# Patient Record
Sex: Female | Born: 2000 | Race: Black or African American | Hispanic: Yes | Marital: Single
Health system: Southern US, Community
[De-identification: ages and names within clinical notes are randomized; demographics above are authoritative.]

## PROBLEM LIST (undated history)

## (undated) ENCOUNTER — Inpatient Hospital Stay (HOSPITAL_COMMUNITY): Payer: Self-pay

## (undated) DIAGNOSIS — F319 Bipolar disorder, unspecified: Secondary | ICD-10-CM

## (undated) DIAGNOSIS — F419 Anxiety disorder, unspecified: Secondary | ICD-10-CM

## (undated) DIAGNOSIS — F913 Oppositional defiant disorder: Secondary | ICD-10-CM

## (undated) DIAGNOSIS — J45909 Unspecified asthma, uncomplicated: Secondary | ICD-10-CM

## (undated) DIAGNOSIS — F39 Unspecified mood [affective] disorder: Secondary | ICD-10-CM

## (undated) DIAGNOSIS — F329 Major depressive disorder, single episode, unspecified: Secondary | ICD-10-CM

## (undated) DIAGNOSIS — F32A Depression, unspecified: Secondary | ICD-10-CM

## (undated) HISTORY — PX: OTHER SURGICAL HISTORY: SHX169

## (undated) HISTORY — PX: TONSILLECTOMY: SUR1361

---
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
## 2018-04-08 ENCOUNTER — Emergency Department (HOSPITAL_COMMUNITY): Payer: Medicaid Other

## 2018-04-08 ENCOUNTER — Other Ambulatory Visit: Payer: Self-pay

## 2018-04-08 ENCOUNTER — Emergency Department (HOSPITAL_COMMUNITY)
Admission: EM | Admit: 2018-04-08 | Discharge: 2018-04-09 | Disposition: A | Discharge status: Home / Self Care | Payer: Medicaid Other | Attending: Emergency Medicine | Admitting: Emergency Medicine

## 2018-04-08 ENCOUNTER — Encounter (HOSPITAL_COMMUNITY): Payer: Self-pay | Admitting: Emergency Medicine

## 2018-04-08 DIAGNOSIS — Z3A01 Less than 8 weeks gestation of pregnancy: Secondary | ICD-10-CM | POA: Insufficient documentation

## 2018-04-08 DIAGNOSIS — J45909 Unspecified asthma, uncomplicated: Secondary | ICD-10-CM | POA: Insufficient documentation

## 2018-04-08 DIAGNOSIS — B9689 Other specified bacterial agents as the cause of diseases classified elsewhere: Secondary | ICD-10-CM

## 2018-04-08 DIAGNOSIS — O26891 Other specified pregnancy related conditions, first trimester: Secondary | ICD-10-CM | POA: Diagnosis not present

## 2018-04-08 DIAGNOSIS — R102 Pelvic and perineal pain: Secondary | ICD-10-CM | POA: Diagnosis not present

## 2018-04-08 DIAGNOSIS — O23591 Infection of other part of genital tract in pregnancy, first trimester: Secondary | ICD-10-CM | POA: Diagnosis not present

## 2018-04-08 DIAGNOSIS — Z7722 Contact with and (suspected) exposure to environmental tobacco smoke (acute) (chronic): Secondary | ICD-10-CM | POA: Insufficient documentation

## 2018-04-08 DIAGNOSIS — Z79899 Other long term (current) drug therapy: Secondary | ICD-10-CM | POA: Insufficient documentation

## 2018-04-08 DIAGNOSIS — N760 Acute vaginitis: Secondary | ICD-10-CM

## 2018-04-08 LAB — WET PREP, GENITAL
Sperm: NONE SEEN
Trich, Wet Prep: NONE SEEN
Yeast Wet Prep HPF POC: NONE SEEN

## 2018-04-08 LAB — COMPREHENSIVE METABOLIC PANEL
ALBUMIN: 3.8 g/dL (ref 3.5–5.0)
ALK PHOS: 69 U/L (ref 38–126)
ALT: 14 U/L (ref 0–44)
ANION GAP: 7 (ref 5–15)
AST: 17 U/L (ref 15–41)
BUN: 12 mg/dL (ref 6–20)
CALCIUM: 9 mg/dL (ref 8.9–10.3)
CO2: 24 mmol/L (ref 22–32)
CREATININE: 0.68 mg/dL (ref 0.44–1.00)
Chloride: 107 mmol/L (ref 98–111)
Glucose, Bld: 86 mg/dL (ref 70–99)
Potassium: 3.4 mmol/L — ABNORMAL LOW (ref 3.5–5.1)
Sodium: 138 mmol/L (ref 135–145)
TOTAL PROTEIN: 6.9 g/dL (ref 6.5–8.1)
Total Bilirubin: 0.4 mg/dL (ref 0.3–1.2)

## 2018-04-08 LAB — URINALYSIS, ROUTINE W REFLEX MICROSCOPIC
Bilirubin Urine: NEGATIVE
Glucose, UA: NEGATIVE mg/dL
HGB URINE DIPSTICK: NEGATIVE
Ketones, ur: 20 mg/dL — AB
LEUKOCYTES UA: NEGATIVE
NITRITE: NEGATIVE
PROTEIN: NEGATIVE mg/dL
SPECIFIC GRAVITY, URINE: 1.028 (ref 1.005–1.030)
pH: 5 (ref 5.0–8.0)

## 2018-04-08 LAB — LIPASE, BLOOD: Lipase: 36 U/L (ref 11–51)

## 2018-04-08 LAB — RAPID HIV SCREEN (HIV 1/2 AB+AG)
HIV 1/2 ANTIBODIES: NONREACTIVE
HIV-1 P24 ANTIGEN - HIV24: NONREACTIVE

## 2018-04-08 LAB — CBC
HCT: 38.6 % (ref 36.0–46.0)
Hemoglobin: 13.4 g/dL (ref 12.0–15.0)
MCH: 31.3 pg (ref 26.0–34.0)
MCHC: 34.7 g/dL (ref 30.0–36.0)
MCV: 90.2 fL (ref 78.0–100.0)
PLATELETS: 340 10*3/uL (ref 150–400)
RBC: 4.28 MIL/uL (ref 3.87–5.11)
RDW: 12.2 % (ref 11.5–15.5)
WBC: 6.8 10*3/uL (ref 4.0–10.5)

## 2018-04-08 LAB — I-STAT BETA HCG BLOOD, ED (MC, WL, AP ONLY): HCG, QUANTITATIVE: 677.3 m[IU]/mL — AB (ref <5)

## 2018-04-08 IMAGING — US US OB TRANSVAGINAL
1 series · 13 of 28 positions shown · non-contrast
Comparison: None.

CLINICAL DATA: Pain.  Positive pregnancy test.

EXAM:
OBSTETRIC <14 WK US AND TRANSVAGINAL OB US
TECHNIQUE: Both transabdominal and transvaginal ultrasound examinations were
performed for complete evaluation of the gestation as well as the
maternal uterus, adnexal regions, and pelvic cul-de-sac.
Transvaginal technique was performed to assess early pregnancy.

[Series 1: us ob transvaginal · 13 of 83 slices shown]
[im 4/83]
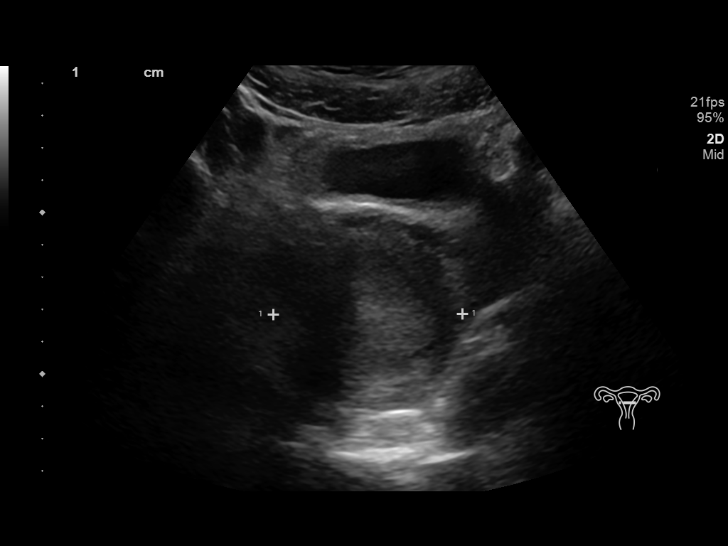
[im 10/83]
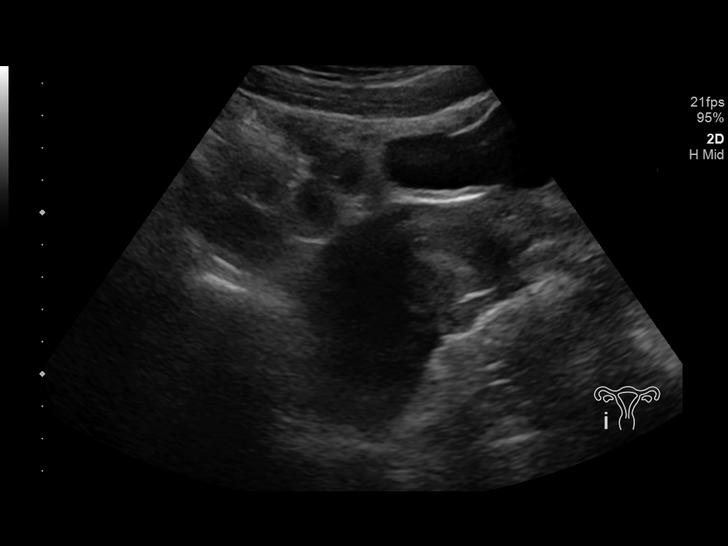
[im 16/83]
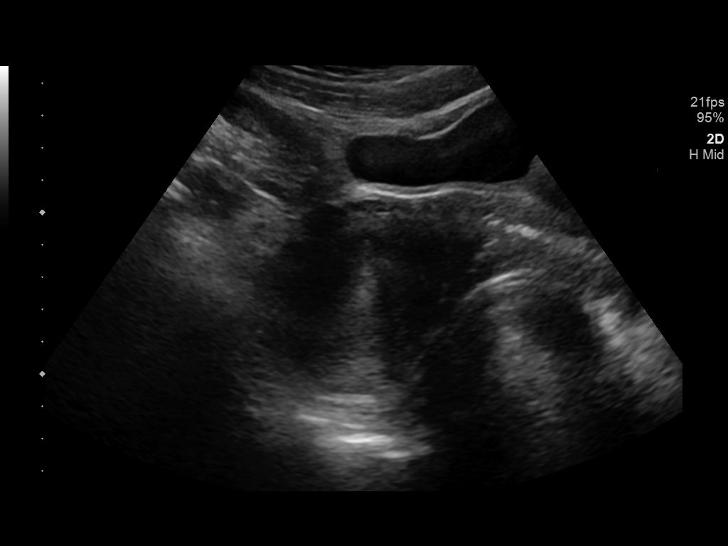
[im 22/83]
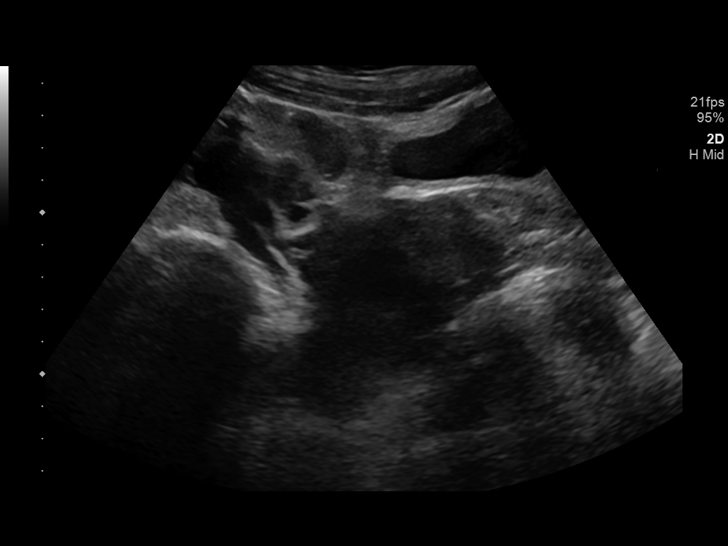
[im 28/83]
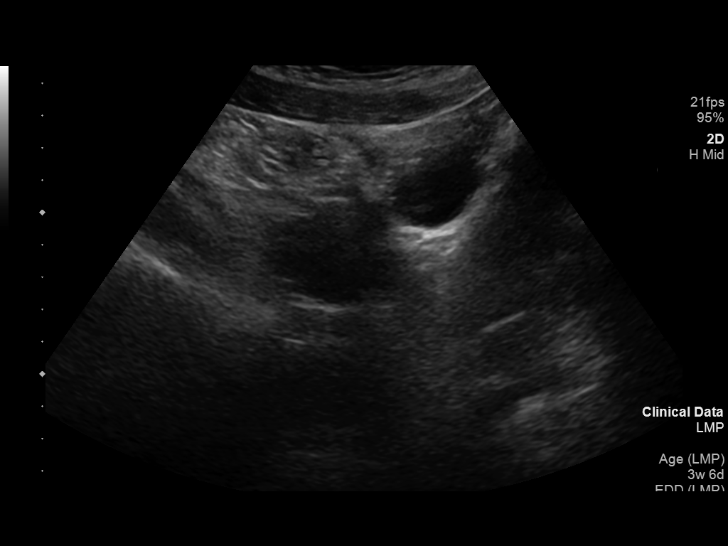
[im 34/83]
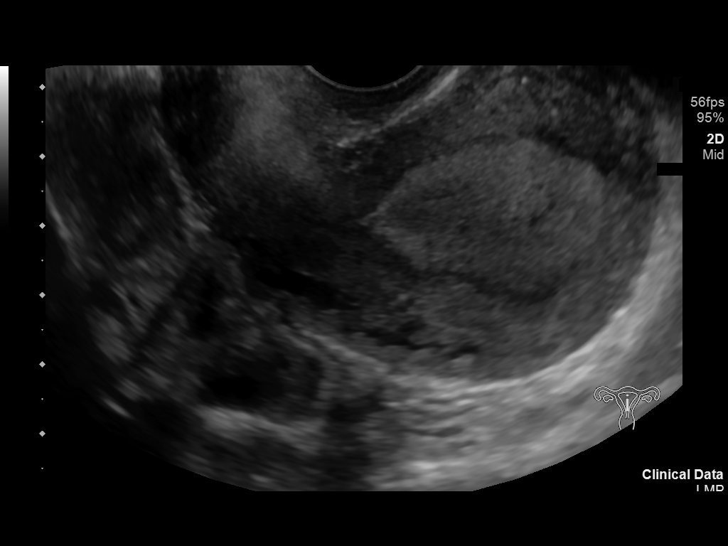
[im 43/83]
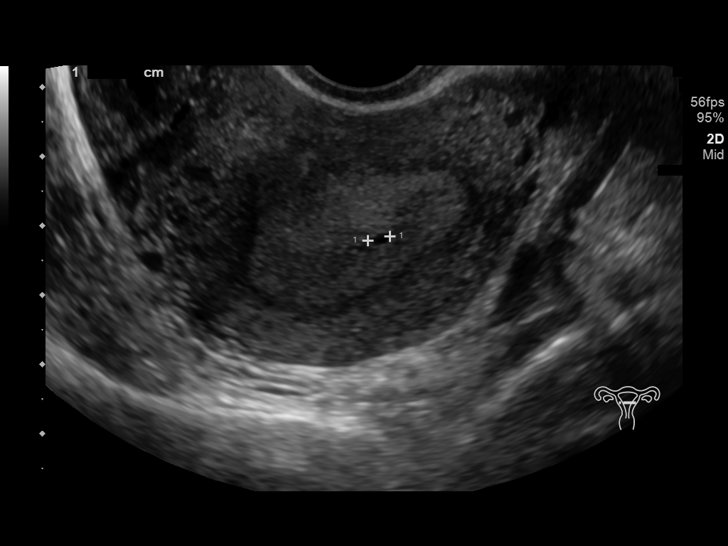
[im 49/83]
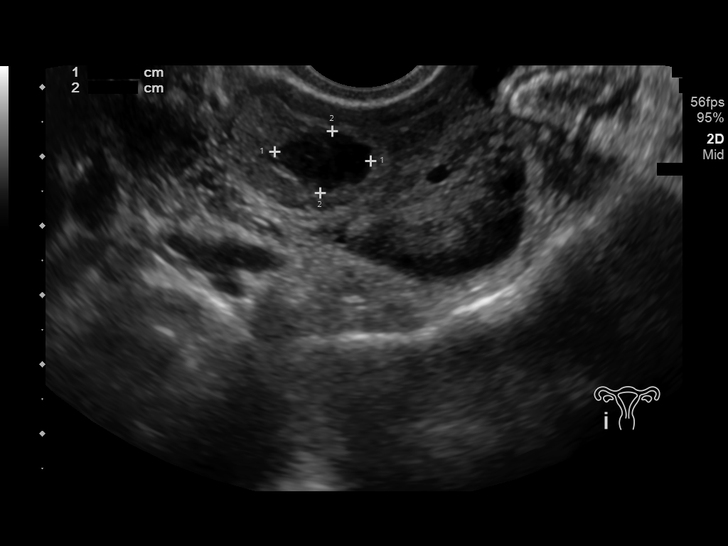
[im 55/83]
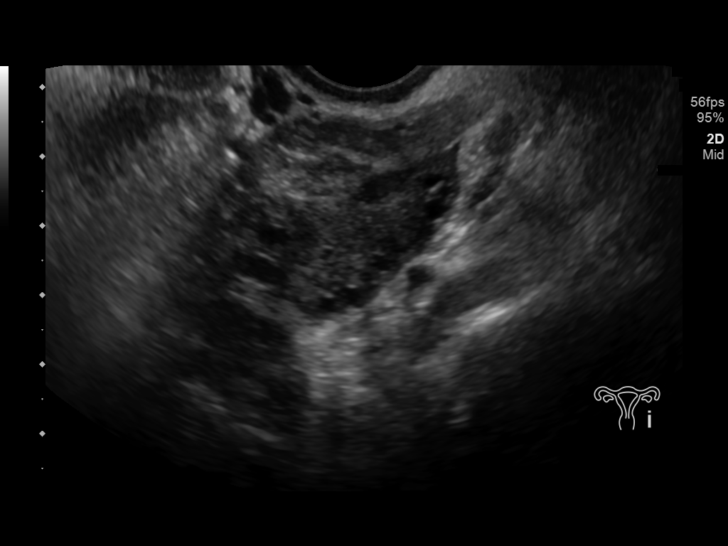
[im 61/83]
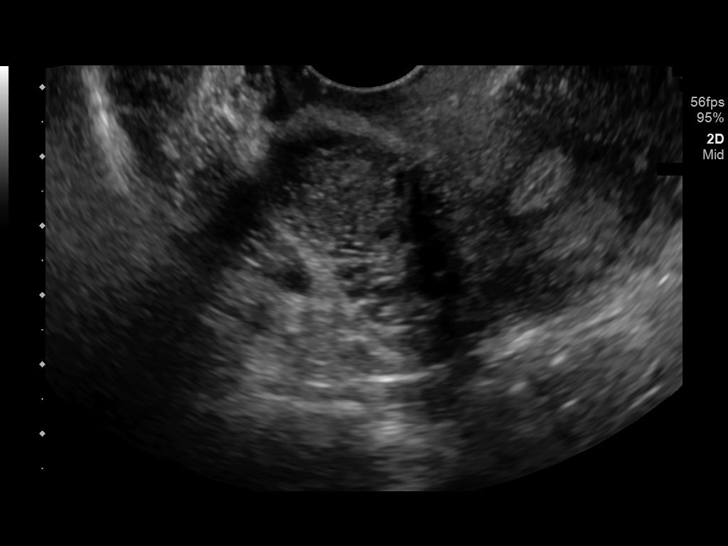
[im 67/83]
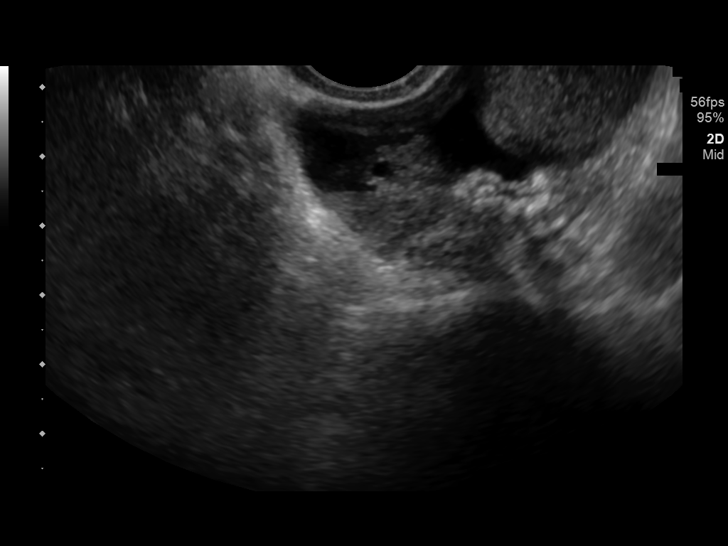
[im 73/83]
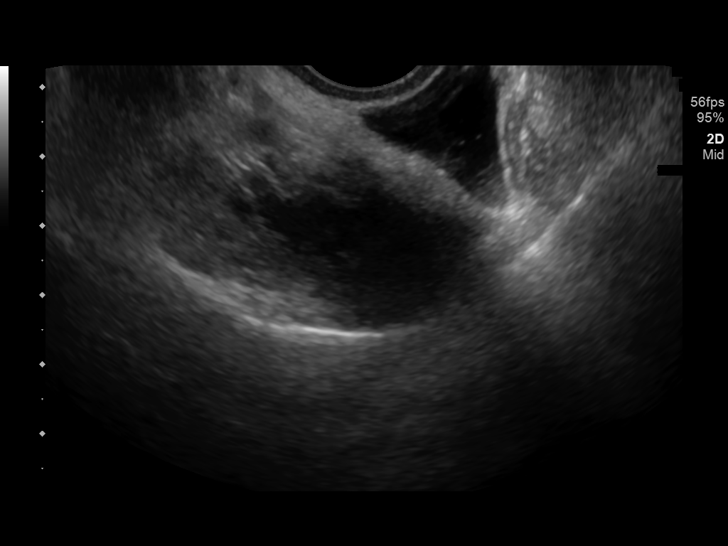
[im 79/83]
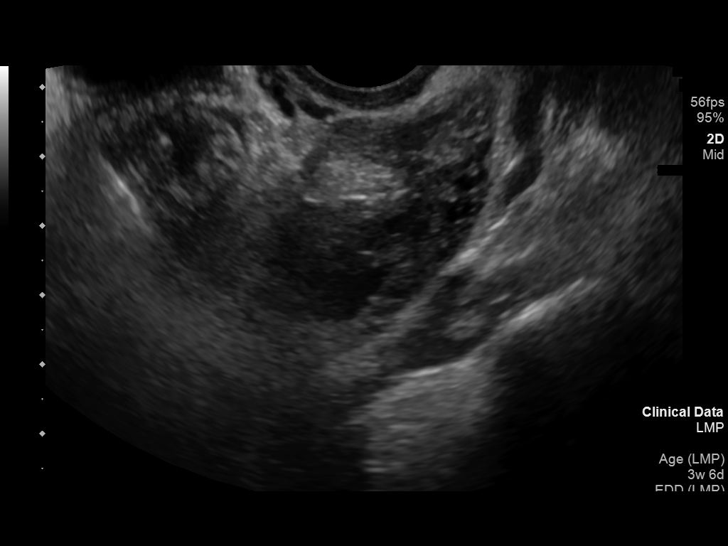

[13 of 28 positions shown; findings below may reference images not displayed]

FINDINGS: Intrauterine gestational sac: Possible tiny gestational sac

Yolk sac:  Not Visualized.

Embryo:  Not Visualized.

Cardiac Activity:

Heart Rate:   bpm

MSD: 2.5 mm   5 w   0 d

CRL:    mm    w    d                  US EDC:

Subchorionic hemorrhage:  None visualized.

Maternal uterus/adnexae: The left ovary is normal. There is an
irregular thick walled cystic structure which was thought to be
intra ovarian at the time of real-time imaging measuring 1.4 x 0.9 x
0.9 cm. No significant fluid in the pelvis. A tiny amount of trace
fluid near the right ovary and the fundus is likely physiologic.
IMPRESSION: 1. There is a tiny oval region of fluid within thickened endometrium
which is too small to confidently characterize. This could represent
an early gestational sac or pseudo gestational sac. As a result,
early pregnancy, ectopic pregnancy, and recent miscarriage are all
possible. Recommend close clinical follow-up. Also, recommend close
imaging and lab follow-up as clinically warranted.
2. There is a thick walled irregular cystic structure in the right
adnexa which was thought to be within the right ovary at the time of
real-time imaging. This is likely a corpus luteum cyst. Recommend
attention on follow-up.

## 2018-04-08 MED ORDER — METRONIDAZOLE 0.75 % VA GEL: 1.0000 | Freq: Two times a day (BID) | VAGINAL | 0 refills | Status: DC

## 2018-04-08 MED ORDER — AZITHROMYCIN 250 MG PO TABS
1000.0000 mg | ORAL_TABLET | Freq: Once | ORAL | Status: AC
  Administered 2018-04-09: 1000 mg via ORAL
  Filled 2018-04-08: qty 4

## 2018-04-08 MED ORDER — CEFTRIAXONE SODIUM 250 MG IJ SOLR
250.0000 mg | Freq: Once | INTRAMUSCULAR | Status: AC
  Administered 2018-04-09: 250 mg via INTRAMUSCULAR
  Filled 2018-04-08: qty 250

## 2018-04-08 MED ORDER — LIDOCAINE HCL 1 % IJ SOLN
INTRAMUSCULAR | Status: AC
  Administered 2018-04-09: 0.9 mL
  Filled 2018-04-08: qty 20

## 2018-04-08 NOTE — ED Provider Notes (Signed)
[facility]-EMERGENCY DEPT Provider Note   CSN: [id number] Arrival date & time: 04/08/18  1958     History   Chief Complaint Chief Complaint  Patient presents with  . Abdominal Pain    HPI Cheryl Harrell is a 17 y.o. female.  The history is provided by the patient. No language interpreter was used.  Abdominal Pain       17 year old female with history of bipolar, oppositional defiant disorder, depression, anxiety presenting for evaluation of abdominal pain.  Patient mention for the past month she has had intermittent abdominal pain.  Pain is usually in her lower abdomen, described as a sharp crampy sensation, sometimes lasting for hours.  She also endorsed having vaginal discharge with odor.  Today she also developed some mild frontal headache.  Her pain is rated a 7 out of 10.  Pain sometimes feels like menstrual cramping in which she used a warm bottle to massage without adequate relief.  No associated fever, chills, chest pains, shortness of breath, back pain, dysuria, hematuria or vaginal bleeding.  Her last menstrual period was 09/03.  She is sexually active.  Past Medical History:  Diagnosis Date  . Anxiety   . Anxiety   . Asthma   . Bipolar 1 disorder (HCC)   . Depression   . Mood disorder (HCC)   . ODD (oppositional defiant disorder)     Patient Active Problem List   Diagnosis Date Noted  . Attention deficit hyperactivity disorder (ADHD), combined type, moderate 06/15/2014  . Chronic post-traumatic stress disorder (PTSD) 06/13/2014  . Oppositional defiant disorder 06/30/2013  . Bipolar I disorder, most recent episode depressed, severe without psychotic features (HCC) 06/30/2013    Past Surgical History:  Procedure Laterality Date  . TONSILLECTOMY    . wisdom tooth removal       OB History   None      Home Medications    Prior to Admission medications   Medication Sig Start Date End Date Taking? Authorizing Provider    ARIPiprazole (ABILIFY) 2 MG tablet Take 1 tablet (2 mg total) by mouth at bedtime. 12/10/14   Withrow, Everardo All, FNP  buPROPion (WELLBUTRIN XL) 300 MG 24 hr tablet Take 1 tablet (300 mg total) by mouth daily. 12/10/14   Withrow, Everardo All, FNP  divalproex (DEPAKOTE ER) 500 MG 24 hr tablet Take 2 tablets (1,000 mg total) by mouth at bedtime. 12/10/14   Withrow, Everardo All, FNP  hydrOXYzine (VISTARIL) 25 MG capsule Take 25 mg by mouth every 8 (eight) hours as needed for anxiety.    [provider]  medroxyPROGESTERone (DEPO-PROVERA) 150 MG/ML injection Inject 1 mL (150 mg total) into the muscle every 3 (three) months. To be managed outpatient and given: September 2nd, 2016 12/10/14   Beau Fanny, FNP  traZODone (DESYREL) 50 MG tablet Take 50 mg by mouth at bedtime.    [provider]    Family History Family History  Problem Relation Age of Onset  . Diabetes Neg Hx   . Heart failure Neg Hx   . Hyperlipidemia Neg Hx     Social History Social History   Tobacco Use  . Smoking status: Passive Smoke Exposure - Never Smoker  . Smokeless tobacco: Never Used  Substance Use Topics  . Alcohol use: No  . Drug use: No     Allergies   Shellfish allergy   Review of Systems Review of Systems  Gastrointestinal: Positive for abdominal pain.  All other systems  reviewed and are negative.    Physical Exam Updated Vital Signs BP 128/74 (BP Location: Left Arm)   Pulse 95   Temp 98.8 F (37.1 C) (Oral)   Resp 17   Ht 5\' 1"  (1.549 m)   Wt 81.6 kg   LMP 03/07/2018   SpO2 100%   BMI 34.01 kg/m   Physical Exam  Constitutional: She appears well-developed and well-nourished. No distress.  HENT:  Head: Atraumatic.  Eyes: Conjunctivae are normal.  Neck: Neck supple.  Cardiovascular: Normal rate and regular rhythm.  Pulmonary/Chest: Effort normal and breath sounds normal.  Abdominal: Soft. Normal appearance and bowel sounds are normal. There is tenderness (Mild suprapubic tenderness  without guarding or rebound tenderness.) in the suprapubic area.  Genitourinary:  Genitourinary Comments: Chaperone present during exam.  No inguinal lymph adenopathy or inguinal hernia noted.  Normal external genitalia.  Discomfort with speculum insertion.  Normal vaginal vault without discharge.  Cervical os is closed.  On bimanual exam no adnexal tenderness or CMT.   Neurological: She is alert.  Skin: No rash noted.  Psychiatric: She has a normal mood and affect.  Nursing note and vitals reviewed.    ED Treatments / Results  Labs (all labs ordered are listed, but only abnormal results are displayed) Labs Reviewed  WET PREP, GENITAL - Abnormal; Notable for the following components:      Result Value   Clue Cells Wet Prep HPF POC PRESENT (*)    WBC, Wet Prep HPF POC FEW (*)    All other components within normal limits  COMPREHENSIVE METABOLIC PANEL - Abnormal; Notable for the following components:   Potassium 3.4 (*)    All other components within normal limits  URINALYSIS, ROUTINE W REFLEX MICROSCOPIC - Abnormal; Notable for the following components:   Ketones, ur 20 (*)    All other components within normal limits  I-STAT BETA HCG BLOOD, ED ([facility], [facility], [facility] ONLY) - Abnormal; Notable for the following components:   I-stat hCG, quantitative 677.3 (*)    All other components within normal limits  LIPASE, BLOOD  CBC  RAPID HIV SCREEN (HIV 1/2 AB+AG)  RPR  GC/CHLAMYDIA PROBE AMP ([place]) NOT AT [facility]    EKG None  Radiology US Ob Comp < 14 Wks  Result Date: 04/08/2018 CLINICAL DATA:  Pain.  Positive pregnancy test. EXAM: OBSTETRIC <14 WK [place] AND TRANSVAGINAL OB US TECHNIQUE: Both transabdominal and transvaginal ultrasound examinations were performed for complete evaluation of the gestation as well as the maternal uterus, adnexal regions, and pelvic cul-de-sac. Transvaginal technique was performed to assess early pregnancy. COMPARISON:  None. FINDINGS: Intrauterine gestational sac:  Possible tiny gestational sac Yolk sac:  Not Visualized. Embryo:  Not Visualized. Cardiac Activity: Heart Rate:   bpm MSD: 2.5 mm   5 w   0 d CRL:    mm    w    d                  [place] EDC: Subchorionic hemorrhage:  None visualized. Maternal uterus/adnexae: The left ovary is normal. There is an irregular thick walled cystic structure which was thought to be intra ovarian at the time of real-time imaging measuring 1.4 x 0.9 x 0.9 cm. No significant fluid in the pelvis. A tiny amount of trace fluid near the right ovary and the fundus is likely physiologic. IMPRESSION: 1. There is a tiny oval region of fluid within thickened endometrium which is too small to confidently characterize. This  could represent an early gestational sac or pseudo gestational sac. As a result, early pregnancy, ectopic pregnancy, and recent miscarriage are all possible. Recommend close clinical follow-up. Also, recommend close imaging and lab follow-up as clinically warranted. 2. There is a thick walled irregular cystic structure in the right adnexa which was thought to be within the right ovary at the time of real-time imaging. This is likely a corpus luteum cyst. Recommend attention on follow-up. Electronically Signed   By: Gerome Sam III M.D   On: 04/08/2018 22:41   US Ob Transvaginal  Result Date: 04/08/2018 CLINICAL DATA:  Pain.  Positive pregnancy test. EXAM: OBSTETRIC <14 WK [place] AND TRANSVAGINAL OB US TECHNIQUE: Both transabdominal and transvaginal ultrasound examinations were performed for complete evaluation of the gestation as well as the maternal uterus, adnexal regions, and pelvic cul-de-sac. Transvaginal technique was performed to assess early pregnancy. COMPARISON:  None. FINDINGS: Intrauterine gestational sac: Possible tiny gestational sac Yolk sac:  Not Visualized. Embryo:  Not Visualized. Cardiac Activity: Heart Rate:   bpm MSD: 2.5 mm   5 w   0 d CRL:    mm    w    d                  [place] EDC: Subchorionic hemorrhage:  None  visualized. Maternal uterus/adnexae: The left ovary is normal. There is an irregular thick walled cystic structure which was thought to be intra ovarian at the time of real-time imaging measuring 1.4 x 0.9 x 0.9 cm. No significant fluid in the pelvis. A tiny amount of trace fluid near the right ovary and the fundus is likely physiologic. IMPRESSION: 1. There is a tiny oval region of fluid within thickened endometrium which is too small to confidently characterize. This could represent an early gestational sac or pseudo gestational sac. As a result, early pregnancy, ectopic pregnancy, and recent miscarriage are all possible. Recommend close clinical follow-up. Also, recommend close imaging and lab follow-up as clinically warranted. 2. There is a thick walled irregular cystic structure in the right adnexa which was thought to be within the right ovary at the time of real-time imaging. This is likely a corpus luteum cyst. Recommend attention on follow-up. Electronically Signed   By: Gerome Sam III M.D   On: 04/08/2018 22:41    Procedures Pelvic exam Date/Time: 04/08/2018 9:20 PM Performed by: Fayrene Helper, PA-C Authorized by: Fayrene Helper, PA-C  Consent: Verbal consent obtained. Risks and benefits: risks, benefits and alternatives were discussed Patient identity confirmed: verbally with patient Local anesthesia used: no  Anesthesia: Local anesthesia used: no  Sedation: Patient sedated: no  Patient tolerance: Patient tolerated the procedure well with no immediate complications    (including critical care time)  Medications Ordered in ED Medications - No data to display   Initial Impression / Assessment and Plan / ED Course  I have reviewed the triage vital signs and the nursing notes.  Pertinent labs & imaging results that were available during my care of the patient were reviewed by me and considered in my medical decision making (see chart for details).     BP 113/63 (BP Location:  Left Arm)   Pulse 83   Temp 99.1 F (37.3 C) (Oral)   Resp 18   Ht 5\' 1"  (1.549 m)   Wt 81.6 kg   LMP 03/07/2018   SpO2 100%   BMI 34.01 kg/m    Final Clinical Impressions(s) / ED Diagnoses   Final diagnoses:  None    ED Discharge Orders    None     8:55 PM Patient report having lower abdominal pain, vaginal discharge, and also concerned about potential pregnancy.  She is sexually active.  She has a fairly benign abdominal exam.  Will perform pelvic examination.  9:21 PM Patient voiced concern that she may be pregnant as she is having unprotected sexual intercourse.  Her last menstrual.  Was a month ago.  Today i-STAT hCG is 677 indicating that patient is indeed pregnant.  Urine shows no signs of urinary tract infection.  Pelvic examination without concerning features to suggest PID.  Will obtain transvaginal ultrasound to rule out ectopic pregnancy.  11:25 PM Wet prep shows presence of clue cells and few WBC.  Rapid HIV screen is negative.  RPR is pending.  Urine without signs of urinary tract infection, transvaginal ultrasound demonstrate a possible tiny gestational sac estimated to be 5 weeks.  However the at this time, early pregnancy, ectopic pregnancy, and recent miscarriage are all possible and patient will need to have close follow-up.  I discussed this finding with patient and recommend [facility] with [facility] for further care.  Patient discharged home with Flagyl gel as treatment for BV.  Return precautions discussed.   Fayrene Helper, PA-C 04/08/18 [id number]    Gerhard Munch, MD 04/08/18 [phone]

## 2018-04-08 NOTE — ED Triage Notes (Signed)
Pt reports having abdominal pain for the last month. Unsure if pt may be pregnant.

## 2018-04-08 NOTE — Discharge Instructions (Signed)
You are estimated to be [redacted] weeks pregnant.  However, please call and follow up closely with [facility] next week for recheck.  Take metrogel vaginal cream twice daily for 1 week as treatment for bacterial vaginosis.

## 2018-04-10 LAB — RPR, QUANT+TP ABS (REFLEX)
Rapid Plasma Reagin, Quant: 1:2 {titer} — ABNORMAL HIGH
TREPONEMA PALLIDUM AB: NEGATIVE

## 2018-04-10 LAB — RPR: RPR Ser Ql: REACTIVE — AB

## 2018-04-10 LAB — GC/CHLAMYDIA PROBE AMP (~~LOC~~) NOT AT ARMC
Chlamydia: NEGATIVE
NEISSERIA GONORRHEA: NEGATIVE

## 2018-04-15 ENCOUNTER — Encounter (HOSPITAL_COMMUNITY): Payer: Self-pay | Admitting: *Deleted

## 2018-04-15 ENCOUNTER — Inpatient Hospital Stay (HOSPITAL_COMMUNITY)
Admission: AD | Admit: 2018-04-15 | Discharge: 2018-04-15 | Disposition: A | Discharge status: Home / Self Care | Payer: Medicaid Other | Source: Ambulatory Visit | Attending: Family Medicine | Admitting: Family Medicine

## 2018-04-15 ENCOUNTER — Inpatient Hospital Stay (HOSPITAL_COMMUNITY): Payer: Medicaid Other

## 2018-04-15 DIAGNOSIS — Z7722 Contact with and (suspected) exposure to environmental tobacco smoke (acute) (chronic): Secondary | ICD-10-CM | POA: Diagnosis not present

## 2018-04-15 DIAGNOSIS — O3680X0 Pregnancy with inconclusive fetal viability, not applicable or unspecified: Secondary | ICD-10-CM

## 2018-04-15 DIAGNOSIS — Z3A01 Less than 8 weeks gestation of pregnancy: Secondary | ICD-10-CM | POA: Insufficient documentation

## 2018-04-15 DIAGNOSIS — N898 Other specified noninflammatory disorders of vagina: Secondary | ICD-10-CM

## 2018-04-15 DIAGNOSIS — R109 Unspecified abdominal pain: Secondary | ICD-10-CM | POA: Diagnosis present

## 2018-04-15 DIAGNOSIS — O26891 Other specified pregnancy related conditions, first trimester: Secondary | ICD-10-CM | POA: Diagnosis not present

## 2018-04-15 LAB — CBC
HEMATOCRIT: 37.3 % (ref 36.0–46.0)
HEMOGLOBIN: 12.9 g/dL (ref 12.0–15.0)
MCH: 31 pg (ref 26.0–34.0)
MCHC: 34.6 g/dL (ref 30.0–36.0)
MCV: 89.7 fL (ref 80.0–100.0)
Platelets: 297 10*3/uL (ref 150–400)
RBC: 4.16 MIL/uL (ref 3.87–5.11)
RDW: 12 % (ref 11.5–15.5)
WBC: 8.4 10*3/uL (ref 4.0–10.5)
nRBC: 0 % (ref 0.0–0.2)

## 2018-04-15 LAB — URINALYSIS, ROUTINE W REFLEX MICROSCOPIC
Bilirubin Urine: NEGATIVE
GLUCOSE, UA: NEGATIVE mg/dL
HGB URINE DIPSTICK: NEGATIVE
Ketones, ur: NEGATIVE mg/dL
Leukocytes, UA: NEGATIVE
Nitrite: NEGATIVE
PH: 7 (ref 5.0–8.0)
Protein, ur: NEGATIVE mg/dL
SPECIFIC GRAVITY, URINE: 1.021 (ref 1.005–1.030)

## 2018-04-15 LAB — WET PREP, GENITAL
CLUE CELLS WET PREP: NONE SEEN
Sperm: NONE SEEN
Trich, Wet Prep: NONE SEEN
YEAST WET PREP: NONE SEEN

## 2018-04-15 LAB — HCG, QUANTITATIVE, PREGNANCY: hCG, Beta Chain, Quant, S: 7862 m[IU]/mL — ABNORMAL HIGH (ref <5)

## 2018-04-15 IMAGING — US US OB TRANSVAGINAL
1 series · 15 of 25 positions shown · non-contrast
Comparison: 04/08/2018.

CLINICAL DATA: Follow-up. Quantitative beta HCG is pending.
Estimated gestational age by LMP is 5 weeks 0 days.

EXAM:
TRANSVAGINAL OB ULTRASOUND
TECHNIQUE: Transvaginal ultrasound was performed for complete evaluation of the
gestation as well as the maternal uterus, adnexal regions, and
pelvic cul-de-sac.

[Series 1: us ob transvaginal · 25 acquisitions, 15 frames shown]
[im 1/25]
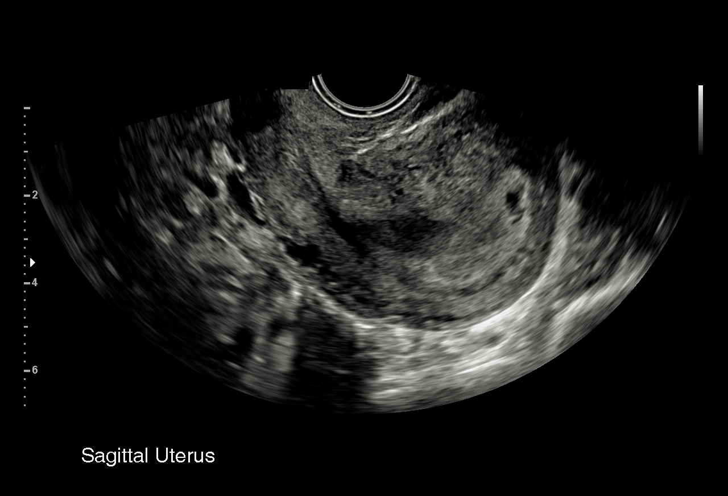
[im 3/25]
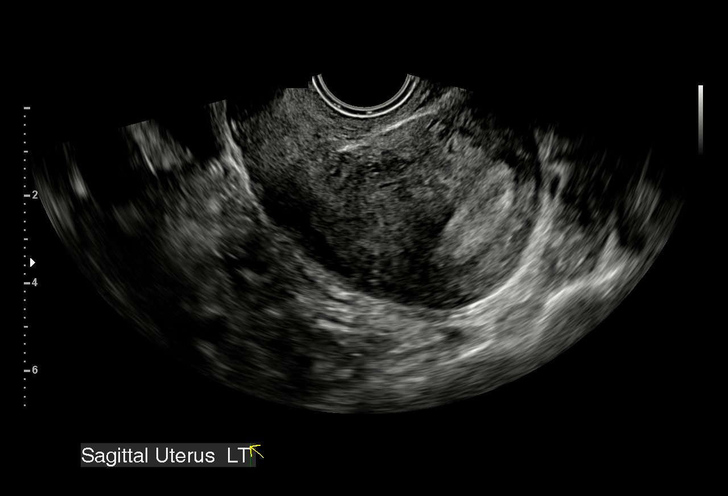
[im 5/25]
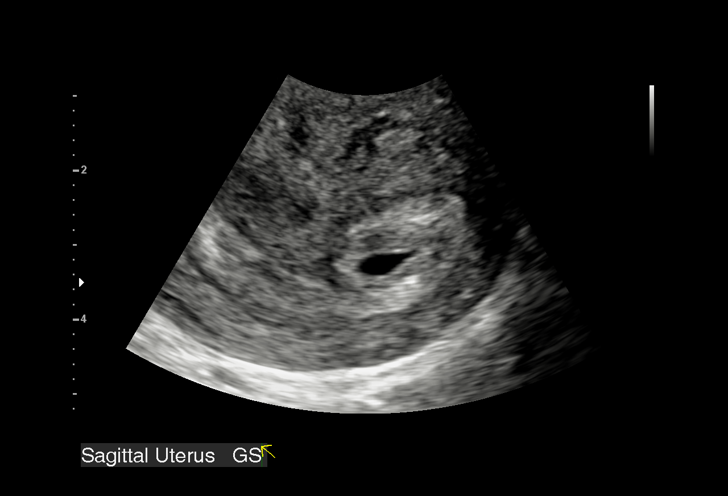
[im 6/25]
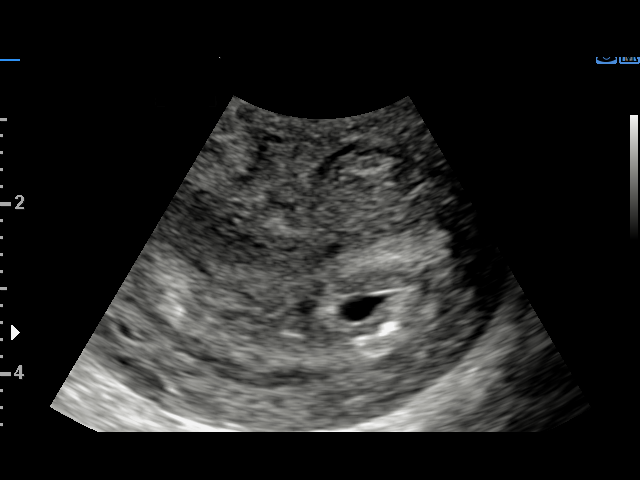
[im 8/25]
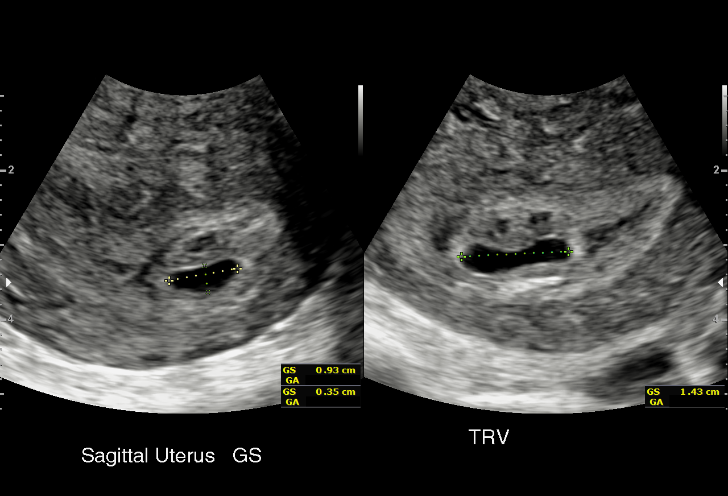
[im 10/25]
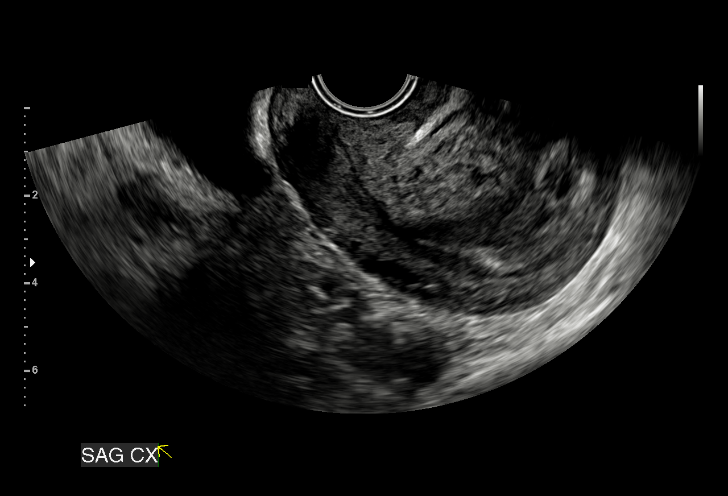
[im 11/25]
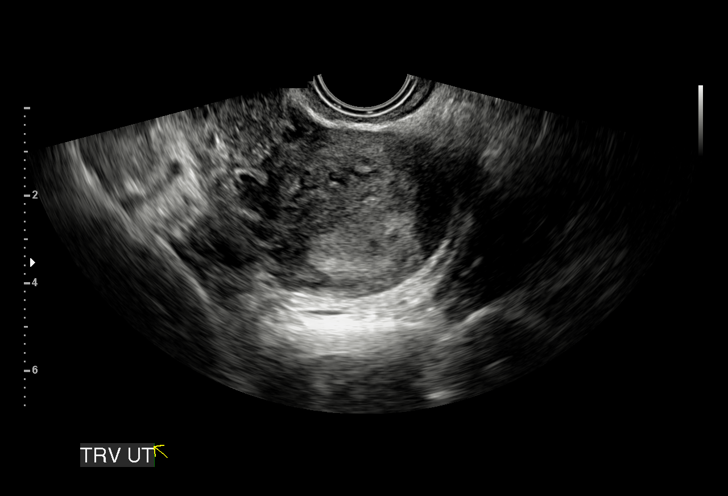
[im 13/25]
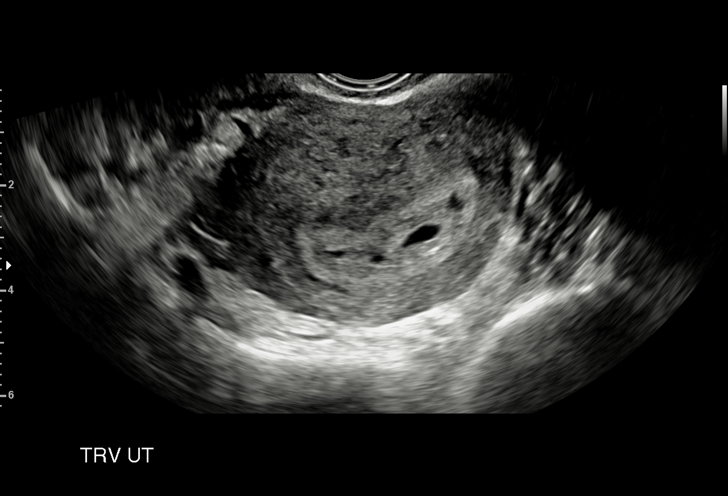
[im 15/25]
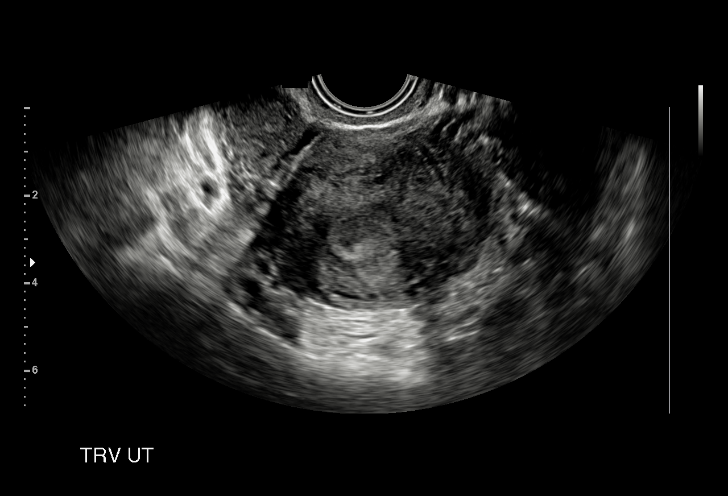
[im 16/25]
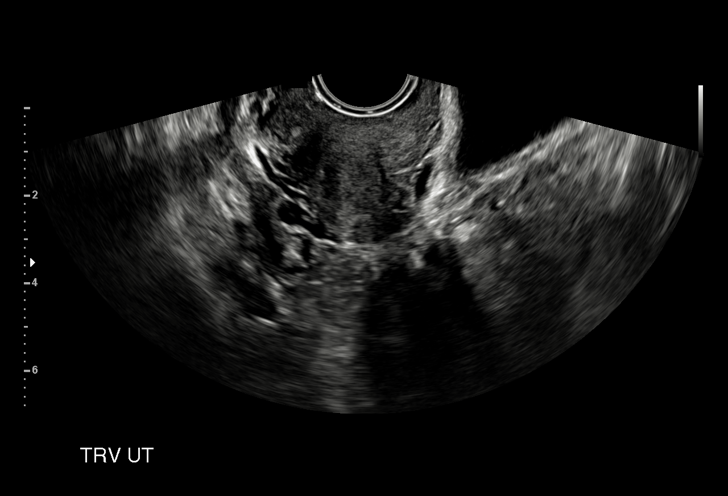
[im 18/25]
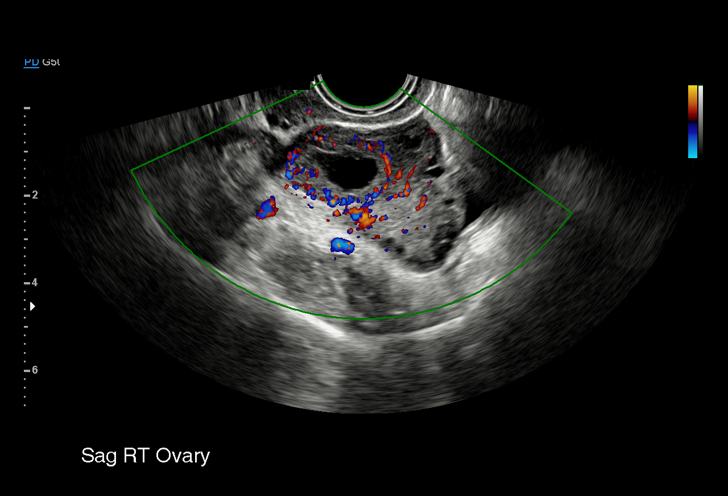
[im 20/25]
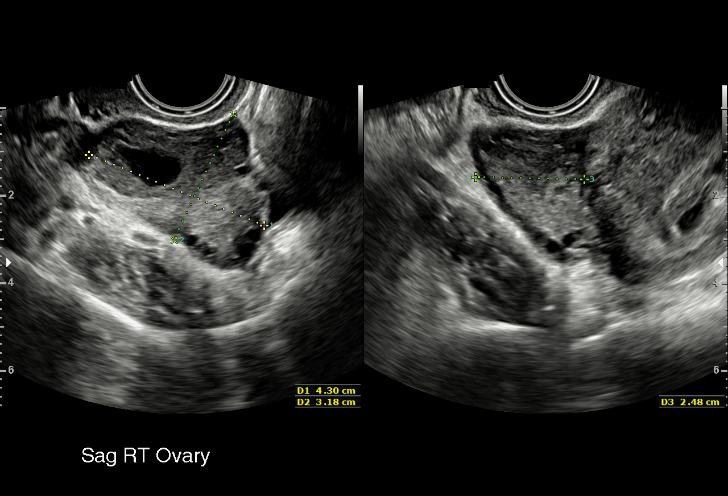
[im 21/25]
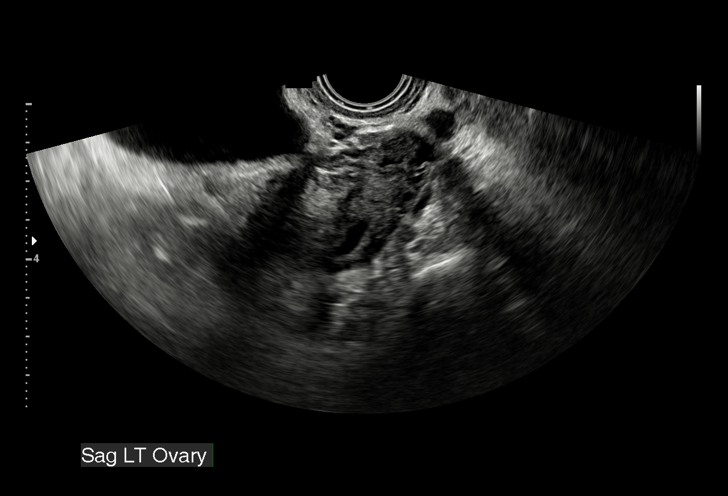
[im 23/25]
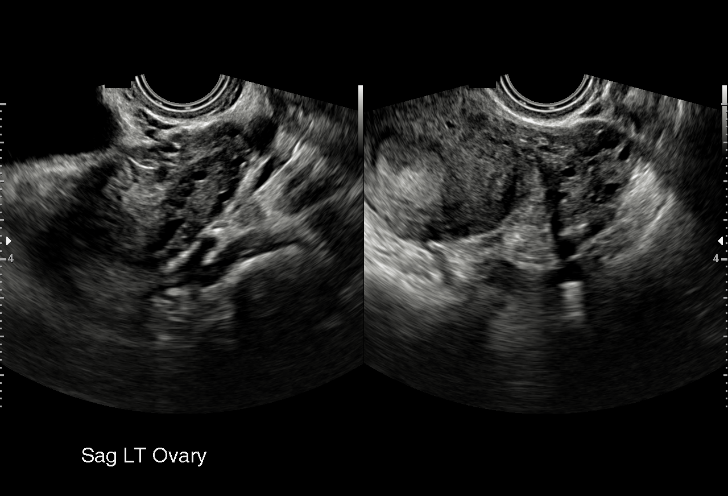
[im 25/25]
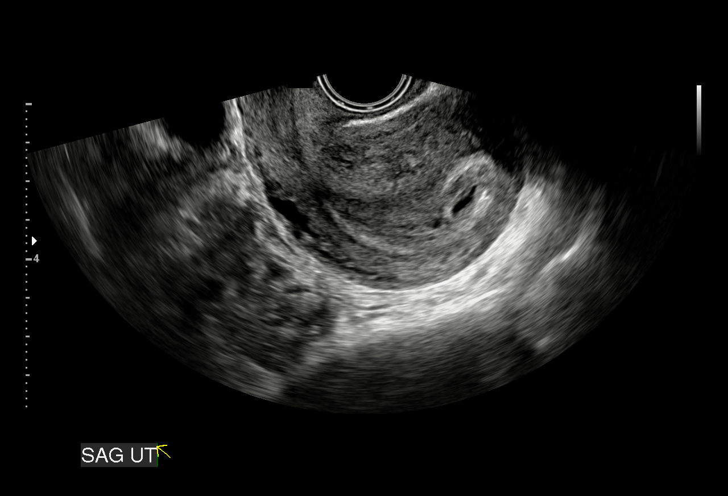

[15 of 25 positions shown; findings below may reference images not displayed]

FINDINGS: Intrauterine gestational sac: A single intrauterine gestational sac
is identified.

Yolk sac:  Not Visualized.

Embryo:  Not Visualized.

Cardiac Activity: Not Visualized.

MSD: 9 mm   5 w   5 d

Subchorionic hemorrhage:  None visualized.

Maternal uterus/adnexae: The uterus is retroverted. No myometrial
mass lesions are identified. Both ovaries are visualized. Probable
corpus luteal cyst on the right. Small amount of free fluid in the
pelvis.
IMPRESSION: Probable early intrauterine gestational sac, but no yolk sac, fetal
pole, or cardiac activity yet visualized. Interval growth in size of
the gestational sac since previous study is appropriate. Recommend
follow-up quantitative B-HCG levels and follow-up US in 14 days to
assess viability. This recommendation follows SRU consensus
guidelines: Diagnostic Criteria for Nonviable Pregnancy Early in the
First Trimester. N Engl J Med 3819; [DATE].

## 2018-04-15 MED ORDER — PRENATAL VITAMINS 0.8 MG PO TABS: 1.0000 | ORAL_TABLET | Freq: Every day | ORAL | Status: AC

## 2018-04-15 NOTE — MAU Note (Signed)
Cheryl Harrell is a 17 y.o. at Unknown here in MAU reporting: went to the ED about 1 week ago. Confirmed pregnancy. Endorses she was told that she was early and that she needed to follow up in 1 week at the [facility] to ensure everything is ok. Was given medication for a bacterial infections; however she thinks it has gotten worse because it has gone from regular discharge to white and clumpy. No odor. LMP:03/11/18 Pain score: denies Denies vaginal bleeding Vitals:   04/15/18 1900  BP: 111/65  Pulse: 79  Resp: 18  Temp: 99 F (37.2 C)  SpO2: 100%

## 2018-04-15 NOTE — MAU Provider Note (Signed)
History    CSN: [id number] Arrival date and time: 04/15/18 4098  Chief Complaint  Patient presents with  . follow up   HPI Cheryl Harrell is an 16yo G1P0 at [redacted]w[redacted]d by LMP who reports to follow-up ER visit for abdominal pain and pregnancy of unknown location. She was seen about a week ago in the ER for crampy, abdominal pain. She states she was told to report to [facility] in one week to follow-up which is why she is here today. Denies any vaginal bleeding. States she was also diagnosed with BV. Thinks odor and discharge is better but has noticed some white clumps. Denies itching. Has one day left of vaginal cream.   OB History    Gravida  1   Para      Term      Preterm      AB      Living        SAB      TAB      Ectopic      Multiple      Live Births              Past Medical History:  Diagnosis Date  . Anxiety   . Anxiety   . Asthma   . Bipolar 1 disorder (HCC)   . Depression   . Mood disorder (HCC)   . ODD (oppositional defiant disorder)     Past Surgical History:  Procedure Laterality Date  . TONSILLECTOMY    . wisdom tooth removal      Family History  Problem Relation Age of Onset  . Diabetes Neg Hx   . Heart failure Neg Hx   . Hyperlipidemia Neg Hx     Social History   Tobacco Use  . Smoking status: Passive Smoke Exposure - Never Smoker  . Smokeless tobacco: Never Used  Substance Use Topics  . Alcohol use: No  . Drug use: No    Allergies:  Allergies  Allergen Reactions  . Shellfish Allergy Other (See Comments)    Unknown, possibly not allergic anymore-per patient    Medications Prior to Admission  Medication Sig Dispense Refill Last Dose  . metroNIDAZOLE (METROGEL VAGINAL) 0.75 % vaginal gel Place 1 Applicatorful vaginally 2 (two) times daily. 70 g 0 04/15/2018 at Unknown time    Review of Systems  Constitutional: Negative for activity change, appetite change and unexpected weight change.  Gastrointestinal: Negative for abdominal  pain, nausea and vomiting.  Genitourinary: Positive for vaginal discharge. Negative for dysuria and vaginal bleeding.  Musculoskeletal: Negative for back pain.  Skin: Negative for rash.  Neurological: Negative for dizziness and light-headedness.   Physical Exam   Blood pressure 111/65, pulse 79, temperature 99 F (37.2 C), temperature source Oral, resp. rate 18, weight 80.3 kg, last menstrual period 03/11/2018, SpO2 100 %.  Physical Exam  Nursing note and vitals reviewed. Constitutional: She appears well-developed and well-nourished. No distress.  HENT:  Head: Normocephalic and atraumatic.  Eyes: Conjunctivae and EOM are normal. No scleral icterus.  Cardiovascular: Normal rate.  Respiratory: No respiratory distress.  GI: Soft. She exhibits no distension. There is no tenderness.  Genitourinary:  Genitourinary Comments: deferred  Psychiatric: She has a normal mood and affect. Her behavior is normal.   MAU Course  Procedures  MDM -- Assumed care of patient - orders placed include TVUS, quant hCG, CBC -- U/S returned with probable intrauterine gestational sac, no yolk sac or fetal pole and appropriate interval growth since last  scan  -- CBC within normal limits  -- wet prep without evidence of yeast infection, U/A unremarkable  Assessment and Plan  16yo G1P0 at [redacted]w[redacted]d by LMP here to follow-up early pregnancy as diagnosed at ER last week. U/S with probably intrauterine gestational sac but no yolk sac or fetal pole, appropriate interval growth since last scan. Asymptomatic today. CBC within normal limits, no vaginal bleeding noted. Unplanned but desired pregnancy. Had I-Stat bHCG at outside hospital, so quantitative hCG repeated today and pending at time of discharge - given unreliable and > 48 hours ago, will plan to repeat hCG in 48-hours and also repeat U/S to confirm early intrauterine pregnancy in 2 weeks. Counseled patient regarding use of prenatal vitamins, medications/foods that are  safe in pregnancy and which to avoid. Plans to establish care with [facility] clinic here at the hospital.   Tamera Stands, DO  04/15/2018, 8:55 PM

## 2018-04-15 NOTE — Discharge Instructions (Signed)
·   Go to [facility] clinic (see address in follow-up) for lab testing on Monday, October 14 in the afternoon.  You should receive a call to schedule an ultrasound for follow-up. Call clinic if you do not receive a call.   Return to the MAU if you develop bleeding or worsening pain.

## 2018-04-15 NOTE — MAU Note (Signed)
Patient not able to void at this time 

## 2018-04-17 ENCOUNTER — Telehealth: Payer: Self-pay | Admitting: Licensed Clinical Social Worker

## 2018-04-17 ENCOUNTER — Ambulatory Visit: Payer: Medicaid Other

## 2018-04-17 LAB — GC/CHLAMYDIA PROBE AMP (~~LOC~~) NOT AT ARMC
Chlamydia: NEGATIVE
NEISSERIA GONORRHEA: NEGATIVE

## 2018-04-17 NOTE — Telephone Encounter (Signed)
Left phone number for call back

## 2018-04-27 ENCOUNTER — Telehealth: Payer: Self-pay | Admitting: Licensed Clinical Social Worker

## 2018-04-27 NOTE — Telephone Encounter (Signed)
Pt. Called. Voicemail left.

## 2018-07-13 ENCOUNTER — Other Ambulatory Visit: Payer: Self-pay

## 2018-07-13 ENCOUNTER — Emergency Department (HOSPITAL_COMMUNITY)
Admission: EM | Admit: 2018-07-13 | Discharge: 2018-07-13 | Disposition: A | Discharge status: Home / Self Care | Payer: Medicaid Other | Attending: Emergency Medicine | Admitting: Emergency Medicine

## 2018-07-13 ENCOUNTER — Emergency Department (HOSPITAL_COMMUNITY): Payer: Medicaid Other

## 2018-07-13 ENCOUNTER — Encounter (HOSPITAL_COMMUNITY): Payer: Self-pay

## 2018-07-13 DIAGNOSIS — Z7722 Contact with and (suspected) exposure to environmental tobacco smoke (acute) (chronic): Secondary | ICD-10-CM | POA: Diagnosis not present

## 2018-07-13 DIAGNOSIS — O209 Hemorrhage in early pregnancy, unspecified: Secondary | ICD-10-CM | POA: Insufficient documentation

## 2018-07-13 DIAGNOSIS — O9989 Other specified diseases and conditions complicating pregnancy, childbirth and the puerperium: Secondary | ICD-10-CM | POA: Diagnosis not present

## 2018-07-13 DIAGNOSIS — Z3A18 18 weeks gestation of pregnancy: Secondary | ICD-10-CM | POA: Diagnosis not present

## 2018-07-13 DIAGNOSIS — O99342 Other mental disorders complicating pregnancy, second trimester: Secondary | ICD-10-CM | POA: Insufficient documentation

## 2018-07-13 DIAGNOSIS — F319 Bipolar disorder, unspecified: Secondary | ICD-10-CM | POA: Insufficient documentation

## 2018-07-13 DIAGNOSIS — O99512 Diseases of the respiratory system complicating pregnancy, second trimester: Secondary | ICD-10-CM | POA: Diagnosis not present

## 2018-07-13 DIAGNOSIS — R1030 Lower abdominal pain, unspecified: Secondary | ICD-10-CM | POA: Insufficient documentation

## 2018-07-13 DIAGNOSIS — F913 Oppositional defiant disorder: Secondary | ICD-10-CM | POA: Diagnosis not present

## 2018-07-13 DIAGNOSIS — F419 Anxiety disorder, unspecified: Secondary | ICD-10-CM | POA: Diagnosis not present

## 2018-07-13 DIAGNOSIS — N939 Abnormal uterine and vaginal bleeding, unspecified: Secondary | ICD-10-CM

## 2018-07-13 DIAGNOSIS — J45909 Unspecified asthma, uncomplicated: Secondary | ICD-10-CM | POA: Insufficient documentation

## 2018-07-13 DIAGNOSIS — F902 Attention-deficit hyperactivity disorder, combined type: Secondary | ICD-10-CM | POA: Diagnosis not present

## 2018-07-13 LAB — URINALYSIS, ROUTINE W REFLEX MICROSCOPIC
BILIRUBIN URINE: NEGATIVE
Glucose, UA: NEGATIVE mg/dL
HGB URINE DIPSTICK: NEGATIVE
KETONES UR: 20 mg/dL — AB
Leukocytes, UA: NEGATIVE
NITRITE: NEGATIVE
PH: 7 (ref 5.0–8.0)
Protein, ur: NEGATIVE mg/dL
Specific Gravity, Urine: 1.025 (ref 1.005–1.030)

## 2018-07-13 LAB — WET PREP, GENITAL
Clue Cells Wet Prep HPF POC: NONE SEEN
Sperm: NONE SEEN
Trich, Wet Prep: NONE SEEN
Yeast Wet Prep HPF POC: NONE SEEN

## 2018-07-13 IMAGING — US US OB LIMITED
1 series · 14 of 23 positions shown · non-contrast
Comparison: none

CLINICAL DATA: Vaginal bleeding. Pregnant patient. Patient is 17
weeks and 5 days pregnant based on her last menstrual period.

EXAM:
LIMITED OBSTETRIC ULTRASOUND

[Series 1: us ob limited · 0.23mm/px · 14 of 23 slices shown]
[im 1/23]
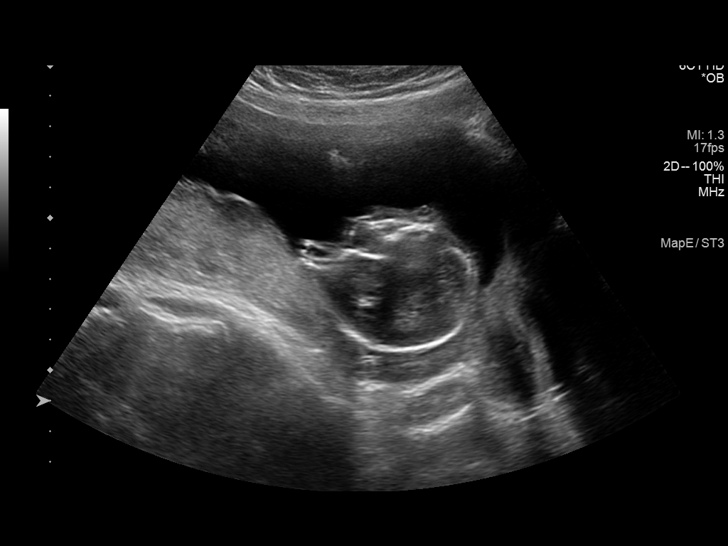
[im 3/23]
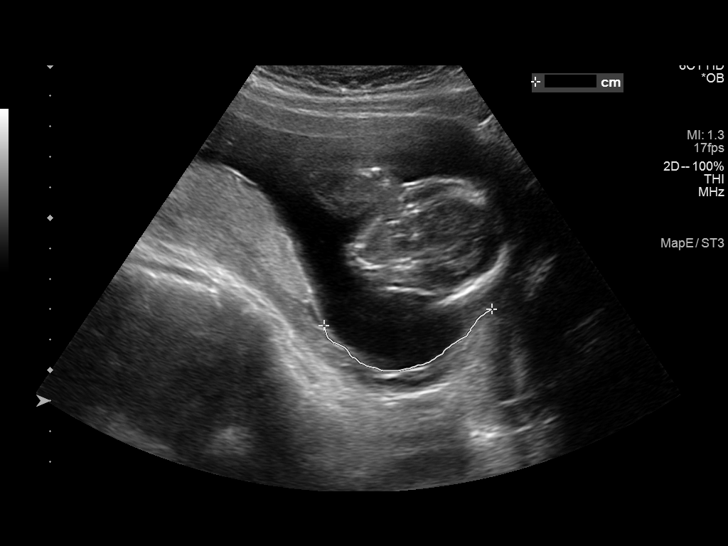
[im 5/23]
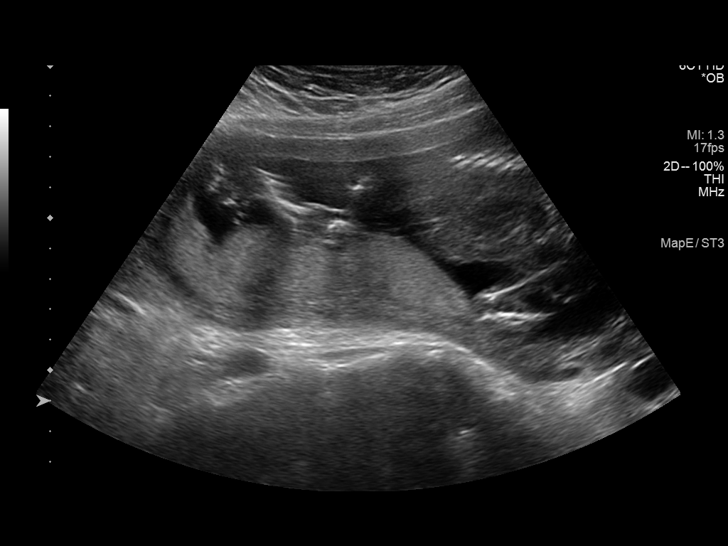
[im 6/23]
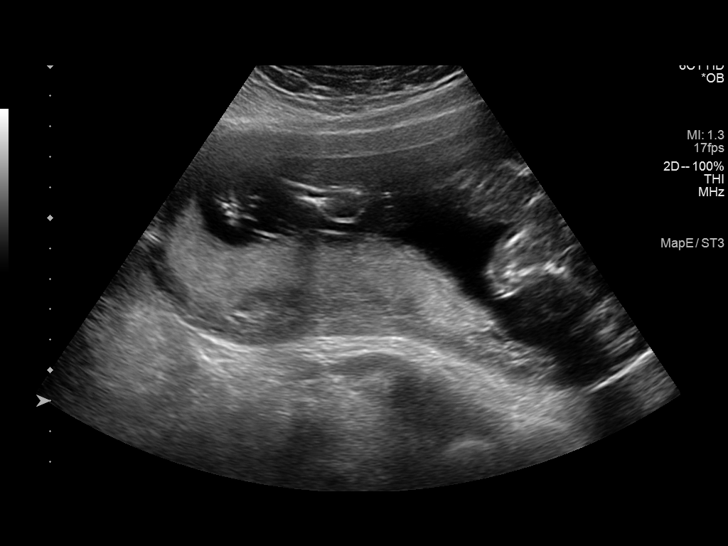
[im 8/23]
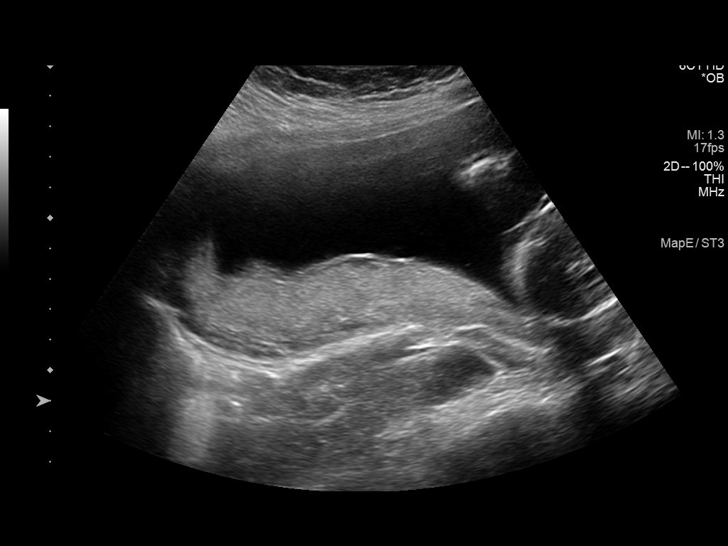
[im 10/23]
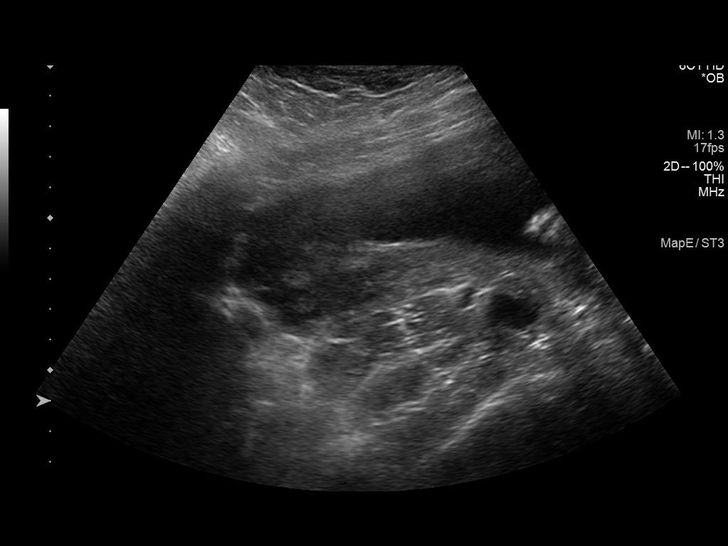
[im 11/23]
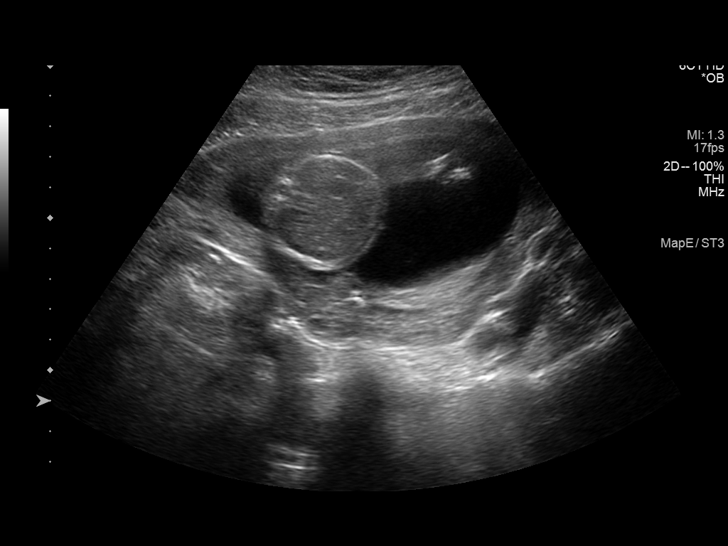
[im 13/23]
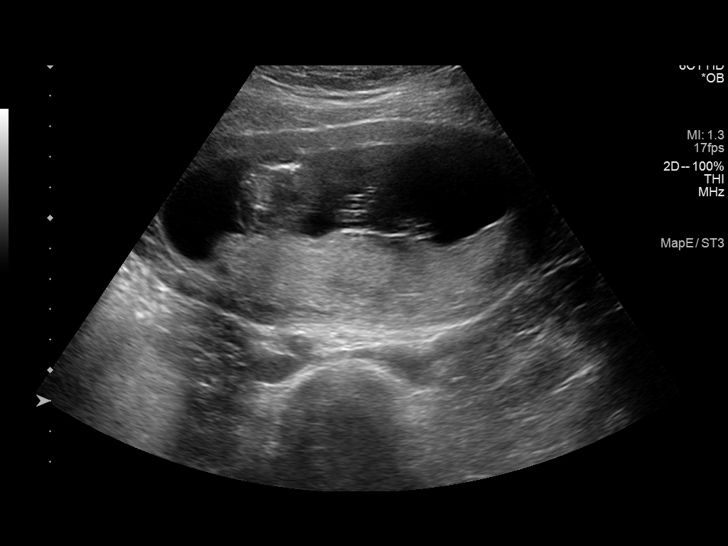
[im 14/23]
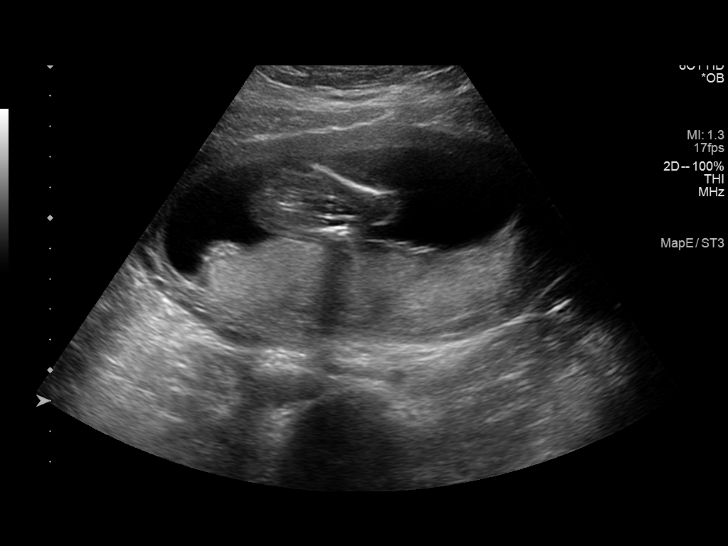
[im 16/23]
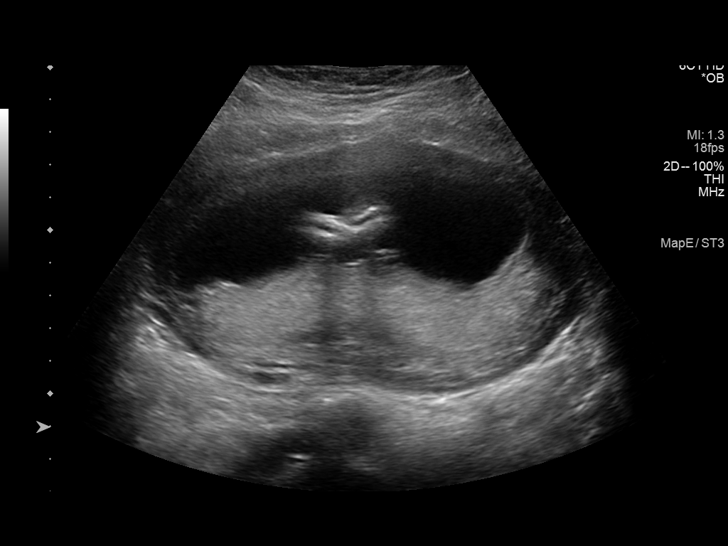
[im 18/23]
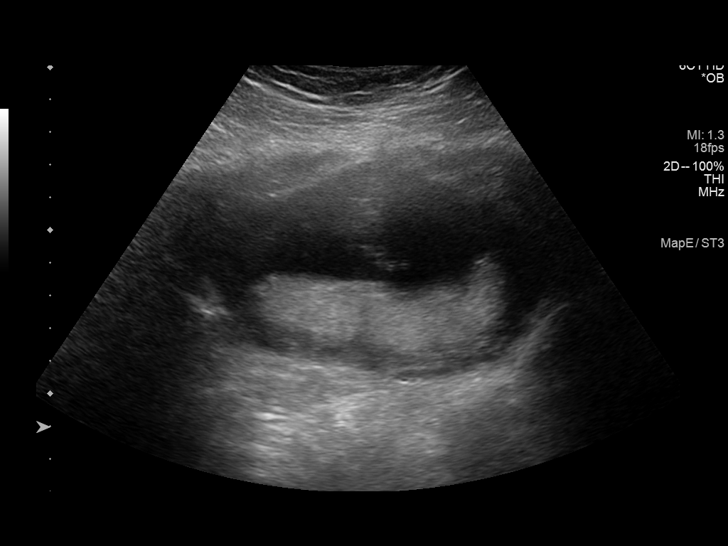
[im 19/23]
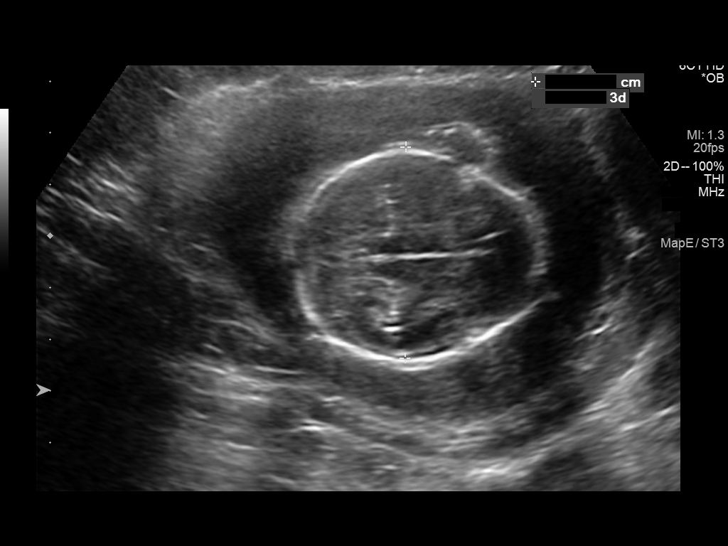
[im 21/23]
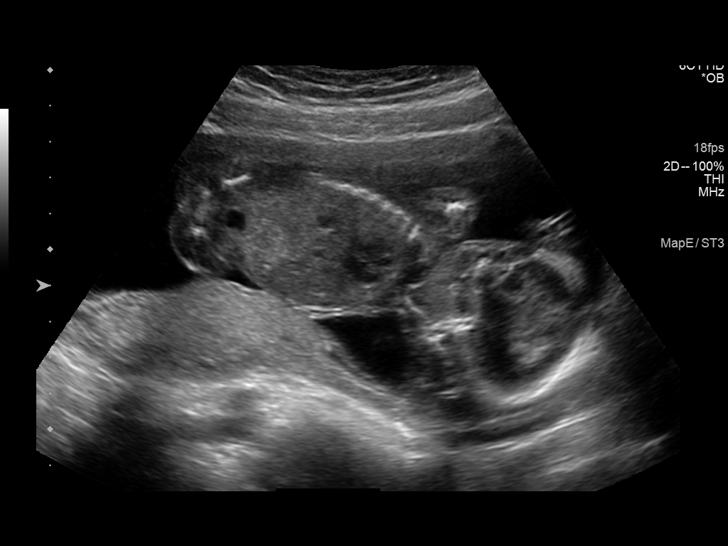
[im 23/23]
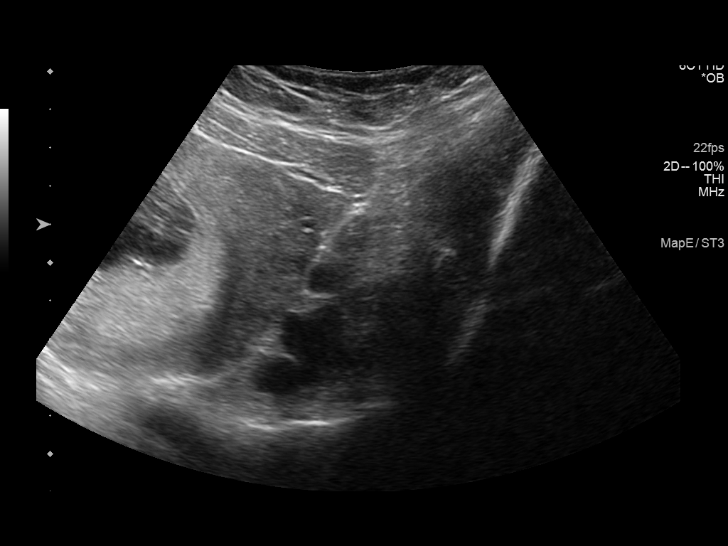

[14 of 23 positions shown; findings below may reference images not displayed]

FINDINGS: Number of Fetuses: 1

Heart Rate:  144 bpm

Movement: Yes

Presentation: Cephalic

Placental Location: Posterior

Previa: No

Amniotic Fluid (Subjective):  Within normal limits.

BPD: 4.09 cm 18 w  3 d

MATERNAL FINDINGS:

Cervix:  Appears closed.

Uterus/Adnexae: No uterine masses. No adnexal masses or abnormality.
No pelvic free fluid.
IMPRESSION: 1. Single live intrauterine pregnancy with a measured gestational
age of 18 weeks and 3 days. No evidence of a pregnancy complication.

This exam is performed on an emergent basis and does not
comprehensively evaluate fetal size, dating, or anatomy; follow-up
complete OB US should be considered if further fetal assessment is
warranted.

## 2018-07-13 NOTE — ED Provider Notes (Signed)
[facility]-EMERGENCY DEPT Provider Note   CSN: [id number] Arrival date & time: 07/13/18  1026     History   Chief Complaint Chief Complaint  Patient presents with  . Vaginal Bleeding    HPI Cheryl Harrell is a 18 y.o. female.  HPI 18 year old female who is approximately [redacted] weeks pregnant presents to the ED for evaluation of some vaginal spotting.  She also reports some "tightness" to her bilateral lower abdomen.  She states that for the past week she is noted some intermittent blood when she wipes.  She also reports 2-day history of the bilateral lower tightness to her abdomen.  She denies any loss of fluids.  She denies any low back pain.  Denies any fevers or chills denies any urinary symptoms.  This is her first pregnancy has had no complications.  The patient states that she last saw her OB doctor last week with normal work-up.  Patient has not taken anything for pain today.  Nothing makes better or worse.  Denies nausea or vomiting.  Denies fevers or chills.  No change in her bowel habits. Past Medical History:  Diagnosis Date  . Anxiety   . Anxiety   . Asthma   . Bipolar 1 disorder (HCC)   . Depression   . Mood disorder (HCC)   . ODD (oppositional defiant disorder)     Patient Active Problem List   Diagnosis Date Noted  . Attention deficit hyperactivity disorder (ADHD), combined type, moderate 06/15/2014  . Chronic post-traumatic stress disorder (PTSD) 06/13/2014  . Oppositional defiant disorder 06/30/2013  . Bipolar I disorder, most recent episode depressed, severe without psychotic features (HCC) 06/30/2013    Past Surgical History:  Procedure Laterality Date  . TONSILLECTOMY    . wisdom tooth removal       OB History    Gravida  1   Para      Term      Preterm      AB      Living        SAB      TAB      Ectopic      Multiple      Live Births               Home Medications    Prior to Admission medications     Medication Sig Start Date End Date Taking? Authorizing Provider  Prenatal Multivit-Min-Fe-FA (PRENATAL VITAMINS) 0.8 MG tablet Take 1 tablet by mouth daily. 04/15/18  Yes Rhett BannisterWallace, Laurel S, DO  metroNIDAZOLE (METROGEL VAGINAL) 0.75 % vaginal gel Place 1 Applicatorful vaginally 2 (two) times daily. Patient not taking: Reported on 07/13/2018 04/08/18   Fayrene Helperran, [place], PA-C    Family History Family History  Problem Relation Age of Onset  . Depression Mother   . Sleep disorder Mother   . Diabetes Neg Hx   . Heart failure Neg Hx   . Hyperlipidemia Neg Hx     Social History Social History   Tobacco Use  . Smoking status: Passive Smoke Exposure - Never Smoker  . Smokeless tobacco: Never Used  Substance Use Topics  . Alcohol use: No  . Drug use: No     Allergies   Shellfish allergy and Penicillins   Review of Systems Review of Systems  Constitutional: Negative for chills and fever.  HENT: Negative for congestion.   Eyes: Negative for discharge.  Gastrointestinal: Positive for abdominal pain. Negative for diarrhea, nausea and vomiting.  Genitourinary:  Positive for pelvic pain and vaginal bleeding. Negative for dysuria, frequency, hematuria and vaginal discharge.  Neurological: Negative for headaches.     Physical Exam Updated Vital Signs BP 122/69   Pulse 72   Temp 98.4 F (36.9 C) (Oral)   Resp 16   Ht 5\' 1"  (1.549 m)   Wt 85.7 kg   LMP 03/11/2018   SpO2 100%   BMI 35.71 kg/m   Physical Exam Vitals signs and nursing note reviewed.  Constitutional:      General: She is not in acute distress.    Appearance: She is well-developed.  HENT:     Head: Normocephalic and atraumatic.  Eyes:     General: No scleral icterus.       Right eye: No discharge.        Left eye: No discharge.  Neck:     Musculoskeletal: Normal range of motion.  Pulmonary:     Effort: No respiratory distress.  Abdominal:     General: Abdomen is flat. Bowel sounds are normal. There is no  distension.     Palpations: Abdomen is soft. There is no mass.     Tenderness: There is no abdominal tenderness. There is no right CVA tenderness, left CVA tenderness, guarding or rebound.     Hernia: No hernia is present.  Genitourinary:    Comments: Chaperone present for exam. No external lesions, swelling, erythema, or rash of the labia. No erythema, discharge, bleeding, or lesions noted in the vaginal vault. No CMT tenderness, bleeding or friability. No adnexal tenderness, mass or fullness bilaterally. No inguinal adenopathy or hernia.   Musculoskeletal: Normal range of motion.  Skin:    General: Skin is warm and dry.     Capillary Refill: Capillary refill takes less than 2 seconds.     Coloration: Skin is not pale.  Neurological:     Mental Status: She is alert.  Psychiatric:        Behavior: Behavior normal.        Thought Content: Thought content normal.        Judgment: Judgment normal.      ED Treatments / Results  Labs (all labs ordered are listed, but only abnormal results are displayed) Labs Reviewed  WET PREP, GENITAL - Abnormal; Notable for the following components:      Result Value   WBC, Wet Prep HPF POC FEW (*)    All other components within normal limits  URINALYSIS, ROUTINE W REFLEX MICROSCOPIC - Abnormal; Notable for the following components:   Ketones, ur 20 (*)    All other components within normal limits  GC/CHLAMYDIA PROBE AMP ([place]    EKG None  Radiology [place] Ob Limited > 14 Wks  Result Date: 07/13/2018 CLINICAL DATA:  Vaginal bleeding. Pregnant patient. Patient is 17 weeks and 5 days pregnant based on her last menstrual period. EXAM: LIMITED OBSTETRIC ULTRASOUND FINDINGS: Number of Fetuses: 1 Heart Rate:  144 bpm Movement: Yes Presentation: Cephalic Placental Location: Posterior Previa: No Amniotic Fluid (Subjective):  Within normal limits. BPD: 4.09 cm 18 w  3 d MATERNAL FINDINGS: Cervix:  Appears closed. Uterus/Adnexae: No  uterine masses. No adnexal masses or abnormality. No pelvic free fluid. IMPRESSION: 1. Single live intrauterine pregnancy with a measured gestational age of [redacted] weeks and 3 days. No evidence of a pregnancy complication. This exam is performed on an emergent basis and does not comprehensively evaluate fetal size, dating, or anatomy; follow-up complete OB US should  be considered if further fetal assessment is warranted. Electronically Signed   By: Amie Portland M.D.   On: 07/13/2018 13:57    Procedures Procedures (including critical care time)  Medications Ordered in ED Medications - No data to display   Initial Impression / Assessment and Plan / ED Course  I have reviewed the triage vital signs and the nursing notes.  Pertinent labs & imaging results that were available during my care of the patient were reviewed by me and considered in my medical decision making (see chart for details).     Patient presents to the ED for evaluation of vaginal spotting and lower abdominal pain.  Patient is presently [redacted] weeks pregnant.  Patient vital signs reassuring in the ED today.  She has no focal abdominal tenderness concerning for peritonitis.  No vaginal bleeding noted on vaginal exam.  Patient's UA shows no signs of infection.  Wet prep shows WBCs with no other acute abnormalities.  Gonorrhea chlamydia cultures pending.  Ultrasound performed shows single live uterine pregnancy without any complications at this time.  I spoke with Dr. Mindi Slicker is on-call for patient's OB/GYN.  She states the patient was seen at 5:00 this morning by OB/GYN with ultrasound as well.  She felt that patient could follow-up in outpatient setting.  Patient was then asked if she saw OB/GYN this morning and she said "oh yeah forgot to tell you".  I did call over to OB/GYN office to have her look at records to see determine patient's blood type before drawing today.  The office states that she is O+.  This is verified by her date of birth  and name.  No need for RhoGam at this time.  Patient to follow-up with OB in outpatient setting.  Tylenol for pain.  Pt is hemodynamically stable, in NAD, & able to ambulate in the ED. Evaluation does not show pathology that would require ongoing emergent intervention or inpatient treatment. I explained the diagnosis to the patient. Pain has been managed & has no complaints prior to dc. Pt is comfortable with above plan and is stable for discharge at this time. All questions were answered prior to disposition. Strict return precautions for f/u to the ED were discussed. Encouraged follow up with PCP.   Final Clinical Impressions(s) / ED Diagnoses   Final diagnoses:  Vaginal bleeding  Lower abdominal pain    ED Discharge Orders    None       Wallace Keller 07/13/18 1656    Vanetta Mulders, MD 07/25/18 [phone]

## 2018-07-13 NOTE — ED Provider Notes (Signed)
Medical screening examination/treatment/procedure(s) were conducted as a shared visit with non-physician practitioner(s) and myself.  I personally evaluated the patient during the encounter.  None  Results for orders placed or performed during the hospital encounter of 07/13/18  Wet prep, genital  Result Value Ref Range   Yeast Wet Prep HPF POC NONE SEEN NONE SEEN   Trich, Wet Prep NONE SEEN NONE SEEN   Clue Cells Wet Prep HPF POC NONE SEEN NONE SEEN   WBC, Wet Prep HPF POC FEW (A) NONE SEEN   Sperm NONE SEEN   Urinalysis, Routine w reflex microscopic  Result Value Ref Range   Color, Urine YELLOW YELLOW   APPearance CLEAR CLEAR   Specific Gravity, Urine 1.025 1.005 - 1.030   pH 7.0 5.0 - 8.0   Glucose, UA NEGATIVE NEGATIVE mg/dL   Hgb urine dipstick NEGATIVE NEGATIVE   Bilirubin Urine NEGATIVE NEGATIVE   Ketones, ur 20 (A) NEGATIVE mg/dL   Protein, ur NEGATIVE NEGATIVE mg/dL   Nitrite NEGATIVE NEGATIVE   Leukocytes, UA NEGATIVE NEGATIVE   US Ob Limited > 14 Wks  Result Date: 07/13/2018 CLINICAL DATA:  Vaginal bleeding. Pregnant patient. Patient is 17 weeks and 5 days pregnant based on her last menstrual period. EXAM: LIMITED OBSTETRIC ULTRASOUND FINDINGS: Number of Fetuses: 1 Heart Rate:  144 bpm Movement: Yes Presentation: Cephalic Placental Location: Posterior Previa: No Amniotic Fluid (Subjective):  Within normal limits. BPD: 4.09 cm 18 w  3 d MATERNAL FINDINGS: Cervix:  Appears closed. Uterus/Adnexae: No uterine masses. No adnexal masses or abnormality. No pelvic free fluid. IMPRESSION: 1. Single live intrauterine pregnancy with a measured gestational age of [redacted] weeks and 3 days. No evidence of a pregnancy complication. This exam is performed on an emergent basis and does not comprehensively evaluate fetal size, dating, or anatomy; follow-up complete OB US should be considered if further fetal assessment is warranted. Electronically Signed   By: Amie Portland M.D.   On: 07/13/2018 13:57     Patient seen by me along with physician assistant.  Patient states she is about 17 weeks and 4 days pregnant she has had some vaginal bleeding and spotting.  She also seen some blood when she urinates for about a week.  She has some tightness in bilateral lower abdomen.  This is patient's first pregnancy.  Patient nontoxic no acute distress.  Abdomen soft.  Ultrasound consistent with intrauterine pregnancy about 18 weeks.  No complicating factors.  Urinalysis is negative for urinary tract infection.  Patient safe for discharge home.  Threatened miscarriage not completely ruled out.  Patient will be a follow-up with her OB/GYN doctor.   Vanetta Mulders, MD 07/13/18 [phone]

## 2018-07-13 NOTE — Discharge Instructions (Addendum)
To follow-up with your OB/GYN doctor next week.  Ultrasound is reassuring today.  Unknown cause of the bleeding.  Watch for any leakage of fluid, worsening pain.  Return the ED if you have any fevers, chills, worsening pain, urinary symptoms or any other reason.

## 2018-07-13 NOTE — ED Triage Notes (Signed)
Patient states she is 17 weeks and 4 days pregnant and has been having vaginal bleeding/spotting. Patient states she has been seeing the blood when she wipes after urinating x 1 week. Patient c/o "tightness" bilateral lower abdomen.

## 2018-07-14 LAB — GC/CHLAMYDIA PROBE AMP (~~LOC~~) NOT AT ARMC
Chlamydia: NEGATIVE
Neisseria Gonorrhea: NEGATIVE

## 2018-12-02 DIAGNOSIS — O1493 Unspecified pre-eclampsia, third trimester: Secondary | ICD-10-CM

## 2019-03-10 ENCOUNTER — Encounter (HOSPITAL_COMMUNITY): Payer: Self-pay

## 2019-07-06 NOTE — L&D Delivery Note (Signed)
Patient arrived on unit with urge to push stating contractions started .  Provider present and cervix checked after SROM.  Patient delivered as below with staff support.  Delivery Note At 9:00 PM a viable female "[place]" was delivered via Vaginal, Spontaneous (Presentation: Left Occiput Anterior).  Shoulders delivered easily and infant with good tone and spontaneous cry. Tactile stimulation given by provider and infant placed on mother's abdomen where nurse continued tactile stimulation. Infant APGAR: 8, 9. After 2 minute delay, the umbilical cord was clamped, cut by support person, and blood collected by provider. Placenta delivered spontaneously via Tomasa Blase and was noted to be intact with 3VC upon inspection.  Patient opts to keep her placenta. Vaginal inspection revealed no lacerations.  Fundus firm, at the umbilicus, and bleeding small.  Mother hemodynamically stable and infant skin to skin prior to provider exit.  Mother desires outpatient Nexplanon for birth control method and opts to breastfeed.  Infant weight at one hour of life: 6lbs 8.4oz, 18in   Anesthesia: None Episiotomy: None Lacerations: None Suture Repair: None Est. Blood Loss (mL): 75  Mom to postpartum.  Baby to Couplet care / Skin to Skin.  Cherre Robins, MSN, CNM 04/11/2020, 9:30 PM

## 2019-11-10 ENCOUNTER — Other Ambulatory Visit: Payer: Self-pay

## 2019-11-10 ENCOUNTER — Emergency Department: Payer: Medicaid Other

## 2019-11-10 ENCOUNTER — Emergency Department
Admission: EM | Admit: 2019-11-10 | Discharge: 2019-11-10 | Disposition: A | Discharge status: Home / Self Care | Payer: Medicaid Other | Attending: Emergency Medicine | Admitting: Emergency Medicine

## 2019-11-10 ENCOUNTER — Encounter: Payer: Self-pay | Admitting: Emergency Medicine

## 2019-11-10 DIAGNOSIS — Z7722 Contact with and (suspected) exposure to environmental tobacco smoke (acute) (chronic): Secondary | ICD-10-CM | POA: Diagnosis not present

## 2019-11-10 DIAGNOSIS — R109 Unspecified abdominal pain: Secondary | ICD-10-CM | POA: Insufficient documentation

## 2019-11-10 DIAGNOSIS — Z79899 Other long term (current) drug therapy: Secondary | ICD-10-CM | POA: Diagnosis not present

## 2019-11-10 DIAGNOSIS — O26892 Other specified pregnancy related conditions, second trimester: Secondary | ICD-10-CM | POA: Insufficient documentation

## 2019-11-10 LAB — URINALYSIS, COMPLETE (UACMP) WITH MICROSCOPIC
Bilirubin Urine: NEGATIVE
Glucose, UA: NEGATIVE mg/dL
Hgb urine dipstick: NEGATIVE
Ketones, ur: NEGATIVE mg/dL
Leukocytes,Ua: NEGATIVE
Nitrite: NEGATIVE
Protein, ur: NEGATIVE mg/dL
Specific Gravity, Urine: 1.026 (ref 1.005–1.030)
pH: 7 (ref 5.0–8.0)

## 2019-11-10 LAB — LIPASE, BLOOD: Lipase: 21 U/L (ref 11–51)

## 2019-11-10 LAB — COMPREHENSIVE METABOLIC PANEL
ALT: 11 U/L (ref 0–44)
AST: 14 U/L — ABNORMAL LOW (ref 15–41)
Albumin: 3.5 g/dL (ref 3.5–5.0)
Alkaline Phosphatase: 57 U/L (ref 38–126)
Anion gap: 7 (ref 5–15)
BUN: 9 mg/dL (ref 6–20)
CO2: 23 mmol/L (ref 22–32)
Calcium: 8.6 mg/dL — ABNORMAL LOW (ref 8.9–10.3)
Chloride: 105 mmol/L (ref 98–111)
Creatinine, Ser: 0.39 mg/dL — ABNORMAL LOW (ref 0.44–1.00)
GFR calc Af Amer: >60 mL/min (ref >60)
GFR calc non Af Amer: >60 mL/min (ref >60)
Glucose, Bld: 83 mg/dL (ref 70–99)
Potassium: 3.4 mmol/L — ABNORMAL LOW (ref 3.5–5.1)
Sodium: 135 mmol/L (ref 135–145)
Total Bilirubin: 0.5 mg/dL (ref 0.3–1.2)
Total Protein: 7 g/dL (ref 6.5–8.1)

## 2019-11-10 LAB — CBC
HCT: 39.8 % (ref 36.0–46.0)
Hemoglobin: 13.9 g/dL (ref 12.0–15.0)
MCH: 31.1 pg (ref 26.0–34.0)
MCHC: 34.9 g/dL (ref 30.0–36.0)
MCV: 89 fL (ref 80.0–100.0)
Platelets: 289 10*3/uL (ref 150–400)
RBC: 4.47 MIL/uL (ref 3.87–5.11)
RDW: 12 % (ref 11.5–15.5)
WBC: 8.9 10*3/uL (ref 4.0–10.5)
nRBC: 0 % (ref 0.0–0.2)

## 2019-11-10 LAB — HCG, QUANTITATIVE, PREGNANCY: hCG, Beta Chain, Quant, S: 22284 m[IU]/mL — ABNORMAL HIGH (ref <5)

## 2019-11-10 MED ORDER — SODIUM CHLORIDE 0.9% FLUSH: 3.0000 mL | Freq: Once | INTRAVENOUS | Status: DC

## 2019-11-10 NOTE — ED Provider Notes (Signed)
[facility] Emergency Department Provider Note  Time seen: 3:13 PM  I have reviewed the triage vital signs and the nursing notes.   HISTORY  Chief Complaint Abdominal Pain and Fatigue   HPI Cheryl Harrell is a 19 y.o. female with a past medical history of anxiety, bipolar, presents to the emergency department for evaluation.  According to the patient she is approximately [redacted] weeks pregnant, has been experiencing some aches and pains in her joints and neck per patient.  States over the last few days she has had some mild discomfort in the lower abdomen.  Denies any dysuria, vaginal bleeding or discharge.  This is the patient's second pregnancy.  States some nausea but she states this has been intermittent throughout her pregnancy.  Denies any diarrhea.   Past Medical History:  Diagnosis Date  . Anxiety   . Anxiety   . Asthma   . Bipolar 1 disorder (HCC)   . Depression   . Mood disorder (HCC)   . ODD (oppositional defiant disorder)     Patient Active Problem List   Diagnosis Date Noted  . Attention deficit hyperactivity disorder (ADHD), combined type, moderate 06/15/2014  . Chronic post-traumatic stress disorder (PTSD) 06/13/2014  . Oppositional defiant disorder 06/30/2013  . Bipolar I disorder, most recent episode depressed, severe without psychotic features (HCC) 06/30/2013    Past Surgical History:  Procedure Laterality Date  . TONSILLECTOMY    . wisdom tooth removal      Prior to Admission medications   Medication Sig Start Date End Date Taking? Authorizing Provider  metroNIDAZOLE (METROGEL VAGINAL) 0.75 % vaginal gel Place 1 Applicatorful vaginally 2 (two) times daily. Patient not taking: Reported on 07/13/2018 04/08/18   Fayrene Helper, PA-C  Prenatal Multivit-Min-Fe-FA (PRENATAL VITAMINS) 0.8 MG tablet Take 1 tablet by mouth daily. 04/15/18   Tamera Stands, DO    Allergies  Allergen Reactions  . Shellfish Allergy Other (See Comments)     Unknown, possibly not allergic anymore-per patient  . Penicillins Other (See Comments)    Blisters on tongue DID THE REACTION INVOLVE: Swelling of the face/tongue/throat, SOB, or low BP? Yes Sudden or severe rash/hives, skin peeling, or the inside of the mouth or nose? No Did it require medical treatment? Yes When did it last happen?4 years ago If all above answers are "NO", may proceed with cephalosporin use.    Family History  Problem Relation Age of Onset  . Depression Mother   . Sleep disorder Mother   . Diabetes Neg Hx   . Heart failure Neg Hx   . Hyperlipidemia Neg Hx     Social History Social History   Tobacco Use  . Smoking status: Passive Smoke Exposure - Never Smoker  . Smokeless tobacco: Never Used  Substance Use Topics  . Alcohol use: No  . Drug use: No    Review of Systems Constitutional: Negative for fever. Cardiovascular: Negative for chest pain. Respiratory: Negative for shortness of breath. Gastrointestinal: Mild lower abdominal discomfort. Genitourinary: Negative for urinary compaints Musculoskeletal: Negative for musculoskeletal complaints Neurological: Negative for headache All other ROS negative  ____________________________________________   PHYSICAL EXAM:  VITAL SIGNS: ED Triage Vitals  Enc Vitals Group     BP 11/10/19 1024 128/74     Pulse Rate 11/10/19 1024 76     Resp 11/10/19 1024 16     Temp 11/10/19 1024 98.4 F (36.9 C)     Temp Source 11/10/19 1024 Oral     SpO2  11/10/19 1024 99 %     Weight 11/10/19 1025 202 lb (91.6 kg)     Height 11/10/19 1025 5\' 1"  (1.549 m)     Head Circumference --      Peak Flow --      Pain Score 11/10/19 1025 7     Pain Loc --      Pain Edu? --      Excl. in [place]? --    Constitutional: Alert and oriented. Well appearing and in no distress. Eyes: Normal exam ENT      Head: Normocephalic and atraumatic.      Mouth/Throat: Mucous membranes are moist. Cardiovascular: Normal rate, regular rhythm.   Respiratory: Normal respiratory effort without tachypnea nor retractions. Breath sounds are clear Gastrointestinal: Soft and nontender. No distention.  Musculoskeletal: Nontender with normal range of motion in all extremities. Neurologic:  Normal speech and language. No gross focal neurologic deficits  Skin:  Skin is warm, dry and intact.  Psychiatric: Mood and affect are normal.  ____________________________________________   RADIOLOGY  Ultrasound shows single IUP 18 weeks 5 days with a heart rate of 153.  ____________________________________________   INITIAL IMPRESSION / ASSESSMENT AND PLAN / ED COURSE  Pertinent labs & imaging results that were available during my care of the patient were reviewed by me and considered in my medical decision making (see chart for details).   Patient presents to the emergency department with various body aches and some lower abdominal discomfort.  Overall the patient appears well, nontender abdomen on examination.  Vitals are reassuring.  Patient's work-up is reassuring in the emergency department.  Ultrasound shows 18-week 5-day IUP with a normal heart rate.  Blood work reassuring.  Normal urinalysis.  Patient reassured by today's findings.  Will follow up with OB/GYN as soon as her Medicaid is approved per patient.  Discussed return precautions.  Cheryl Harrell was evaluated in Emergency Department on 11/10/2019 for the symptoms described in the history of present illness. She was evaluated in the context of the global COVID-19 pandemic, which necessitated consideration that the patient might be at risk for infection with the SARS-CoV-2 virus that causes COVID-19. Institutional protocols and algorithms that pertain to the evaluation of patients at risk for COVID-19 are in a state of rapid change based on information released by regulatory bodies including the CDC and federal and state organizations. These policies and algorithms were followed during the  patient's care in the ED.  ____________________________________________   FINAL CLINICAL IMPRESSION(S) / ED DIAGNOSES  Lower abdominal discomfort during pregnancy   Harvest Dark, MD 11/10/19 1515

## 2019-11-10 NOTE — ED Triage Notes (Signed)
Pt presents to ED via POV, pt c/o lower abdominal pain and back pain. Pt states pain x several days. Pt states is currently pregnant, G2P1L1, due date 04/09/20, is currently 18w 3d. Pt also states increasing fatigue. Pt states recently moved and does not have Obgyn here, pt states is unsure when last prenatal care appt was, but thinks it was when she was about 12 weeks. Pt denies vaginal bleeding at this time.

## 2019-11-10 NOTE — ED Notes (Signed)
Room darkened for patient's comfort. 

## 2019-11-17 ENCOUNTER — Other Ambulatory Visit: Payer: Self-pay

## 2019-11-17 ENCOUNTER — Encounter (HOSPITAL_COMMUNITY): Payer: Self-pay | Admitting: Emergency Medicine

## 2019-11-17 ENCOUNTER — Emergency Department (HOSPITAL_COMMUNITY)
Admission: EM | Admit: 2019-11-17 | Discharge: 2019-11-17 | Disposition: A | Discharge status: Home / Self Care | Payer: Medicaid Other | Attending: Emergency Medicine | Admitting: Emergency Medicine

## 2019-11-17 DIAGNOSIS — R42 Dizziness and giddiness: Secondary | ICD-10-CM | POA: Insufficient documentation

## 2019-11-17 DIAGNOSIS — Z3A19 19 weeks gestation of pregnancy: Secondary | ICD-10-CM | POA: Diagnosis not present

## 2019-11-17 DIAGNOSIS — O219 Vomiting of pregnancy, unspecified: Secondary | ICD-10-CM | POA: Diagnosis not present

## 2019-11-17 DIAGNOSIS — Z7722 Contact with and (suspected) exposure to environmental tobacco smoke (acute) (chronic): Secondary | ICD-10-CM | POA: Insufficient documentation

## 2019-11-17 DIAGNOSIS — Z79899 Other long term (current) drug therapy: Secondary | ICD-10-CM | POA: Diagnosis not present

## 2019-11-17 LAB — URINALYSIS, ROUTINE W REFLEX MICROSCOPIC
Bacteria, UA: NONE SEEN
Bilirubin Urine: NEGATIVE
Glucose, UA: NEGATIVE mg/dL
Hgb urine dipstick: NEGATIVE
Ketones, ur: 80 mg/dL — AB
Nitrite: NEGATIVE
Protein, ur: 30 mg/dL — AB
Specific Gravity, Urine: 1.023 (ref 1.005–1.030)
pH: 7 (ref 5.0–8.0)

## 2019-11-17 LAB — BASIC METABOLIC PANEL
Anion gap: 8 (ref 5–15)
BUN: 7 mg/dL (ref 6–20)
CO2: 25 mmol/L (ref 22–32)
Calcium: 9 mg/dL (ref 8.9–10.3)
Chloride: 103 mmol/L (ref 98–111)
Creatinine, Ser: 0.49 mg/dL (ref 0.44–1.00)
GFR calc Af Amer: >60 mL/min (ref >60)
GFR calc non Af Amer: >60 mL/min (ref >60)
Glucose, Bld: 89 mg/dL (ref 70–99)
Potassium: 3.4 mmol/L — ABNORMAL LOW (ref 3.5–5.1)
Sodium: 136 mmol/L (ref 135–145)

## 2019-11-17 LAB — CBC
HCT: 41.5 % (ref 36.0–46.0)
Hemoglobin: 14 g/dL (ref 12.0–15.0)
MCH: 30.8 pg (ref 26.0–34.0)
MCHC: 33.7 g/dL (ref 30.0–36.0)
MCV: 91.2 fL (ref 80.0–100.0)
Platelets: 278 10*3/uL (ref 150–400)
RBC: 4.55 MIL/uL (ref 3.87–5.11)
RDW: 12.3 % (ref 11.5–15.5)
WBC: 9 10*3/uL (ref 4.0–10.5)
nRBC: 0 % (ref 0.0–0.2)

## 2019-11-17 LAB — CBG MONITORING, ED: Glucose-Capillary: 90 mg/dL (ref 70–99)

## 2019-11-17 LAB — I-STAT BETA HCG BLOOD, ED (MC, WL, AP ONLY): I-stat hCG, quantitative: >2000 m[IU]/mL — ABNORMAL HIGH (ref <5)

## 2019-11-17 MED ORDER — ACETAMINOPHEN 500 MG PO TABS
1000.0000 mg | ORAL_TABLET | Freq: Once | ORAL | Status: AC
  Administered 2019-11-17: 1000 mg via ORAL
  Filled 2019-11-17: qty 2

## 2019-11-17 MED ORDER — SODIUM CHLORIDE 0.9% FLUSH: 3.0000 mL | Freq: Once | INTRAVENOUS | Status: DC

## 2019-11-17 MED ORDER — ONDANSETRON 4 MG PO TBDP
4.0000 mg | ORAL_TABLET | Freq: Three times a day (TID) | ORAL | 0 refills | Status: DC | PRN
Start: 2019-11-17 — End: 2019-12-25

## 2019-11-17 MED ORDER — LACTATED RINGERS IV BOLUS
1000.0000 mL | Freq: Once | INTRAVENOUS | Status: AC
  Administered 2019-11-17: 1000 mL via INTRAVENOUS

## 2019-11-17 MED ORDER — ASPIRIN EC 81 MG PO TBEC
81.0000 mg | DELAYED_RELEASE_TABLET | Freq: Every day | ORAL | 1 refills | Status: DC
Start: 2019-11-17 — End: 2020-04-13

## 2019-11-17 MED ORDER — ONDANSETRON HCL 4 MG/2ML IJ SOLN
4.0000 mg | Freq: Once | INTRAMUSCULAR | Status: AC
  Administered 2019-11-17: 4 mg via INTRAVENOUS
  Filled 2019-11-17: qty 2

## 2019-11-17 NOTE — ED Triage Notes (Signed)
Pt reports dizziness that began last night while she was cooking, endorses vomiting over the past few days, states she is [redacted] weeks pregnant. Denies any vaginal bleeding at this time. A/ox4.

## 2019-11-17 NOTE — ED Notes (Signed)
Pt tolerating ginger ale 

## 2019-11-17 NOTE — ED Provider Notes (Signed)
[facility] EMERGENCY DEPARTMENT Provider Note   CSN: [id number] Arrival date & time: 11/17/19  4854     History Chief Complaint  Patient presents with  . Dizziness    Cheryl Harrell is a 19 y.o. female.  19 year old female G2P1001 at 58 weeks w/ h/o preeclampsia, ADHD, bipolar disorder, asthma who presents with dizziness.  Patient states that she recently moved here from [place] where she was previously having obstetrical care.  She reports having morning sickness with this pregnancy as well as her previous pregnancy but states that it has been intermittent.  She was doing fine recently until last night when she vomited after she ate dinner.  She had a few more episodes of vomiting up stomach acid.  She reports that she began feeling dizzy she describes as lightheaded and weak.  She denies any abdominal pain, vaginal bleeding, or fluid.  No diarrhea, fevers, or recent illness.  The history is provided by the patient.  Dizziness      Past Medical History:  Diagnosis Date  . Anxiety   . Anxiety   . Asthma   . Bipolar 1 disorder ([facility])   . Depression   . Mood disorder ([place])   . ODD (oppositional defiant disorder)     Patient Active Problem List   Diagnosis Date Noted  . Attention deficit hyperactivity disorder (ADHD), combined type, moderate 06/15/2014  . Chronic post-traumatic stress disorder (PTSD) 06/13/2014  . Oppositional defiant disorder 06/30/2013  . Bipolar I disorder, most recent episode depressed, severe without psychotic features ([place]) 06/30/2013    Past Surgical History:  Procedure Laterality Date  . TONSILLECTOMY    . wisdom tooth removal       OB History    Gravida  2   Para      Term      Preterm      AB      Living        SAB      TAB      Ectopic      Multiple      Live Births              Family History  Problem Relation Age of Onset  . Depression Mother   . Sleep disorder Mother   . Diabetes Neg Hx     . Heart failure Neg Hx   . Hyperlipidemia Neg Hx     Social History   Tobacco Use  . Smoking status: Passive Smoke Exposure - Never Smoker  . Smokeless tobacco: Never Used  Substance Use Topics  . Alcohol use: No  . Drug use: No    Home Medications Prior to Admission medications   Medication Sig Start Date End Date Taking? Authorizing Provider  acetaminophen (TYLENOL) 500 MG tablet Take 1,000 mg by mouth every 6 (six) hours as needed for mild pain or headache.   Yes [provider]  diphenhydrAMINE (BENADRYL) 25 mg capsule Take 25 mg by mouth every 6 (six) hours as needed for allergies.   Yes [provider]  Prenatal Multivit-Min-Fe-FA (PRENATAL VITAMINS) 0.8 MG tablet Take 1 tablet by mouth daily. 04/15/18  Yes Glenice Bow, DO  aspirin EC 81 MG tablet Take 1 tablet (81 mg total) by mouth daily. 11/17/19   Amila Callies, Wenda Overland, MD  metroNIDAZOLE (METROGEL VAGINAL) 0.75 % vaginal gel Place 1 Applicatorful vaginally 2 (two) times daily. Patient not taking: Reported on 07/13/2018 04/08/18   Domenic Moras, PA-C  ondansetron (  ZOFRAN ODT) 4 MG disintegrating tablet Take 1 tablet (4 mg total) by mouth every 8 (eight) hours as needed for nausea or vomiting. 11/17/19   Julieanna Geraci, Ambrose Finland, MD    Allergies    Shellfish allergy and Penicillins  Review of Systems   Review of Systems  Neurological: Positive for dizziness.   All other systems reviewed and are negative except that which was mentioned in HPI  Physical Exam Updated Vital Signs BP 109/63   Pulse 64   Temp 98.4 F (36.9 C)   Resp 19   Ht 5\' 1"  (1.549 m)   Wt 91.6 kg   SpO2 100%   BMI 38.17 kg/m   Physical Exam Vitals and nursing note reviewed.  Constitutional:      General: She is not in acute distress.    Appearance: She is well-developed.  HENT:     Head: Normocephalic and atraumatic.     Mouth/Throat:     Mouth: Mucous membranes are moist.     Pharynx: Oropharynx is clear.  Eyes:      Conjunctiva/sclera: Conjunctivae normal.  Cardiovascular:     Rate and Rhythm: Normal rate and regular rhythm.     Heart sounds: Normal heart sounds. No murmur.  Pulmonary:     Effort: Pulmonary effort is normal.     Breath sounds: Normal breath sounds.  Abdominal:     General: Bowel sounds are normal.     Palpations: Abdomen is soft.     Tenderness: There is no abdominal tenderness.     Comments: Difficult to palpate uterine fundus 2/2 obese abdomen  Musculoskeletal:     Cervical back: Neck supple.  Skin:    General: Skin is warm and dry.  Neurological:     Mental Status: She is alert and oriented to person, place, and time.     Comments: Fluent speech  Psychiatric:        Judgment: Judgment normal.     ED Results / Procedures / Treatments   Labs (all labs ordered are listed, but only abnormal results are displayed) Labs Reviewed  BASIC METABOLIC PANEL - Abnormal; Notable for the following components:      Result Value   Potassium 3.4 (*)    All other components within normal limits  URINALYSIS, ROUTINE W REFLEX MICROSCOPIC - Abnormal; Notable for the following components:   APPearance CLOUDY (*)    Ketones, ur 80 (*)    Protein, ur 30 (*)    Leukocytes,Ua SMALL (*)    All other components within normal limits  I-STAT BETA HCG BLOOD, ED (MC, WL, AP ONLY) - Abnormal; Notable for the following components:   I-stat hCG, quantitative >2,000.0 (*)    All other components within normal limits  URINE CULTURE  CBC  CBG MONITORING, ED    EKG EKG Interpretation  Date/Time:  Saturday Nov 17 2019 07:36:49 EDT Ventricular Rate:  94 PR Interval:  164 QRS Duration: 74 QT Interval:  354 QTC Calculation: 442 R Axis:   93 Text Interpretation: Normal sinus rhythm with sinus arrhythmia Rightward axis Nonspecific T wave abnormality Abnormal ECG No previous ECGs available Confirmed by 07-11-1981 [phone]) on 11/17/2019 9:37:42 AM   Radiology No results  found.  Procedures Procedures (including critical care time)  Medications Ordered in ED Medications  sodium chloride flush (NS) 0.9 % injection 3 mL (3 mLs Intravenous Not Given 11/17/19 1020)  lactated ringers bolus 1,000 mL (0 mLs Intravenous Stopped 11/17/19 1202)  ondansetron (ZOFRAN) injection  4 mg (4 mg Intravenous Given 11/17/19 1035)  acetaminophen (TYLENOL) tablet 1,000 mg (1,000 mg Oral Given 11/17/19 1104)    ED Course  I have reviewed the triage vital signs and the nursing notes.  Pertinent labs that were available during my care of the patient were reviewed by me and considered in my medical decision making (see chart for details).    MDM Rules/Calculators/A&P                      Well-appearing on exam, no complaints and no abdominal tenderness.  Her lab work is reassuring against dehydration, CBC is normal.  Gave the patient fluids and Zofran and p.o. challenged. Pt ambulatory without problems in the ED.   Chart shows very recent ED visit showing reassuring [place].  Concerned the patient does not have obstetric care established here in [place] and she mentioned needing to be on aspirin daily for unclear reasons, therefore I contacted OB/GYN on-call and discussed with Dr. Macon Large, who advised she can continue ASA due to hx of preeclampsia. She will send patient's info to clinic to assist w/ f/u. I discussed plan w/ patient who voiced understanding. Final Clinical Impression(s) / ED Diagnoses Final diagnoses:  Dizziness  Vomiting during pregnancy    Rx / DC Orders ED Discharge Orders         Ordered    aspirin EC 81 MG tablet  Daily     11/17/19 1142    ondansetron (ZOFRAN ODT) 4 MG disintegrating tablet  Every 8 hours PRN     11/17/19 1142           Milessa Hogan, Ambrose Finland, MD 11/17/19 1547

## 2019-11-18 LAB — URINE CULTURE: Culture: >=100000 — AB

## 2019-12-07 ENCOUNTER — Other Ambulatory Visit: Payer: Self-pay

## 2019-12-07 ENCOUNTER — Ambulatory Visit (INDEPENDENT_AMBULATORY_CARE_PROVIDER_SITE_OTHER): Payer: Self-pay | Admitting: Medical

## 2019-12-07 ENCOUNTER — Encounter: Payer: Self-pay | Admitting: Medical

## 2019-12-07 ENCOUNTER — Other Ambulatory Visit (HOSPITAL_COMMUNITY)
Admission: RE | Admit: 2019-12-07 | Discharge: 2019-12-07 | Disposition: A | Discharge status: Home / Self Care | Payer: Medicaid Other | Source: Ambulatory Visit | Attending: Medical | Admitting: Medical

## 2019-12-07 VITALS — BP 96/58 | HR 74 | Wt 203.9 lb

## 2019-12-07 DIAGNOSIS — F913 Oppositional defiant disorder: Secondary | ICD-10-CM

## 2019-12-07 DIAGNOSIS — O99342 Other mental disorders complicating pregnancy, second trimester: Secondary | ICD-10-CM

## 2019-12-07 DIAGNOSIS — O0932 Supervision of pregnancy with insufficient antenatal care, second trimester: Secondary | ICD-10-CM | POA: Insufficient documentation

## 2019-12-07 DIAGNOSIS — O99891 Other specified diseases and conditions complicating pregnancy: Secondary | ICD-10-CM

## 2019-12-07 DIAGNOSIS — O09892 Supervision of other high risk pregnancies, second trimester: Secondary | ICD-10-CM

## 2019-12-07 DIAGNOSIS — Z8759 Personal history of other complications of pregnancy, childbirth and the puerperium: Secondary | ICD-10-CM | POA: Insufficient documentation

## 2019-12-07 DIAGNOSIS — M549 Dorsalgia, unspecified: Secondary | ICD-10-CM

## 2019-12-07 DIAGNOSIS — O09292 Supervision of pregnancy with other poor reproductive or obstetric history, second trimester: Secondary | ICD-10-CM

## 2019-12-07 DIAGNOSIS — O0990 Supervision of high risk pregnancy, unspecified, unspecified trimester: Secondary | ICD-10-CM | POA: Insufficient documentation

## 2019-12-07 DIAGNOSIS — F902 Attention-deficit hyperactivity disorder, combined type: Secondary | ICD-10-CM

## 2019-12-07 DIAGNOSIS — Z3A22 22 weeks gestation of pregnancy: Secondary | ICD-10-CM

## 2019-12-07 DIAGNOSIS — Z3492 Encounter for supervision of normal pregnancy, unspecified, second trimester: Secondary | ICD-10-CM | POA: Insufficient documentation

## 2019-12-07 DIAGNOSIS — F314 Bipolar disorder, current episode depressed, severe, without psychotic features: Secondary | ICD-10-CM

## 2019-12-07 DIAGNOSIS — O0992 Supervision of high risk pregnancy, unspecified, second trimester: Secondary | ICD-10-CM

## 2019-12-07 DIAGNOSIS — O26892 Other specified pregnancy related conditions, second trimester: Secondary | ICD-10-CM

## 2019-12-07 HISTORY — DX: Supervision of other high risk pregnancies, second trimester: O09.892

## 2019-12-07 HISTORY — DX: Supervision of high risk pregnancy, unspecified, unspecified trimester: O09.90

## 2019-12-07 LAB — POCT URINALYSIS DIP (DEVICE)
Bilirubin Urine: NEGATIVE
Glucose, UA: NEGATIVE mg/dL
Hgb urine dipstick: NEGATIVE
Ketones, ur: NEGATIVE mg/dL
Leukocytes,Ua: NEGATIVE
Nitrite: NEGATIVE
Protein, ur: NEGATIVE mg/dL
Specific Gravity, Urine: 1.02 (ref 1.005–1.030)
Urobilinogen, UA: 0.2 mg/dL (ref 0.0–1.0)
pH: 7.5 (ref 5.0–8.0)

## 2019-12-07 MED ORDER — COMFORT FIT MATERNITY SUPP LG MISC: 1.0000 [IU] | Freq: Every day | 0 refills | Status: DC | PRN

## 2019-12-07 NOTE — Progress Notes (Signed)
PRENATAL VISIT NOTE  Subjective:  Cheryl Harrell is a 19 y.o. G2P1001 at 13w4dbeing seen today for her first prenatal visit for this pregnancy.  She is currently monitored for the following issues for this high-risk pregnancy and has Oppositional defiant disorder; Bipolar I disorder, most recent episode depressed, severe without psychotic features ([place]; Chronic post-traumatic stress disorder (PTSD); Attention deficit hyperactivity disorder (ADHD), combined type, moderate; Supervision of high risk pregnancy, antepartum; History of pre-eclampsia in prior pregnancy, currently pregnant in second trimester; Late prenatal care affecting pregnancy in second trimester; and Short interval between pregnancies affecting pregnancy in second trimester, antepartum on their problem list.  Patient reports backache.  Contractions: Not present. Vag. Bleeding: None.  Movement: Present. Denies leaking of fluid.   She is planning to breastfeed. Desires IUD for contraception.   The following portions of the patient's history were reviewed and updated as appropriate: allergies, current medications, past family history, past medical history, past social history, past surgical history and problem list.   Objective:   Vitals:   12/07/19 1023  BP: (!) 96/58  Pulse: 74  Weight: 203 lb 14.4 oz (92.5 kg)    Fetal Status: Fetal Heart Rate (bpm): 150   Movement: Present     General:  Alert, oriented and cooperative. Patient is in no acute distress.  Skin: Skin is warm and dry. No rash noted.   Cardiovascular: Normal heart rate and rhythm noted  Respiratory: Normal respiratory effort, no problems with respiration noted. Clear to auscultation.   Abdomen: Soft, gravid, appropriate for gestational age. Normal bowel sounds. Non-tender. Pain/Pressure: Present     Pelvic: Cervical exam performed Dilation: Closed Effacement (%): Thick   Normal cervical contour, no lesions, no bleeding following pap, normal discharge    Extremities: Normal range of motion.  Edema: Trace  Mental Status: Normal mood and affect. Normal behavior. Normal judgment and thought content.   Assessment and Plan:  Pregnancy: G2P1001 at 244w4d. Supervision of high risk pregnancy, antepartum, second trimester - CBC/D/Plt+RPR+Rh+ABO+Rub Ab... - CHL AMB BABYSCRIPTS SCHEDULE OPTIMIZATION - GC/Chlamydia probe amp ([place])not at [facility] - Genetic Screening - [place] OB DETAIL +14 WK; scheduled  2. Late prenatal care affecting pregnancy in second trimester - First prenatal visit at 22 weeks   3. History of pre-eclampsia in prior pregnancy, currently pregnant in second trimester - IOL with last pregnancy due to pre-eclampsia, no BP medications PP [place] Comp Met (CMET) - Protein / creatinine ratio, urine - Taking ASA   4. Attention deficit hyperactivity disorder (ADHD), combined type, moderate  5. Oppositional defiant disorder  6. Bipolar I disorder, most recent episode depressed, severe without psychotic features ([place]- No medications currently  - Declined IBHC today  - Elevated PHQ9 and GAD7 without concern for SI  7. Short interval between pregnancies affecting pregnancy in second trimester, antepartum - Last delivery 11/2018  8. Back pain affecting pregnancy in second trimester - Elastic Bandages & Supports (COMFORT FIT MATERNITY SUPP LG) MISC; 1 Units by Does not apply route daily as needed.  Dispense: 1 each; Refill: 0 - Information given for [employer], advised that if too costly there without Medicaid, can get without Rx at [facility] or [facility]   Preterm labor/second trimester warning symptoms and general obstetric precautions including but not limited to vaginal bleeding, contractions, leaking of fluid and fetal movement were reviewed in detail with the patient. Please refer to After Visit Summary for other counseling recommendations.  Discussed the normal visit cadence for prenatal  care Discussed the nature of our practice with multiple providers including residents and students   Return in about 4 weeks (around 01/04/2020) for LOB, In-Person, 28 week labs (fasting).  Future Appointments  Date Time Provider [place]  12/14/2019 10:15 AM [facility] NURSE [facility]  12/14/2019 10:15 AM [facility] US2 [facility]  01/04/2020  8:20 AM [facility]WOCA LAB [facility]  01/04/2020  9:15 AM Hillard Danker, Myles Rosenthal, PA-C [facility]    Kerry Hough, PA-C

## 2019-12-07 NOTE — Patient Instructions (Addendum)
AREA PEDIATRIC/FAMILY PRACTICE PHYSICIANS  [place] ([address]) . [facility] o Chambliss, MD; Eniola, MD; Hale, MD; Hensel, MD; McDiarmid, MD; McIntyer, MD; Neal, MD; Walden, MD o [address] [place], [place] [address] o [phone] o Mon-Fri 8:30-12:30, 1:30-5:00 o Providers come to see babies at [facility] o Accepting Medicaid . [facility] o Limited providers who accept newborns: Koirala, MD; Morrow, MD; Wolters, MD o [address], [place], [place] [address] o [phone] o Mon-Fri 8:00-5:30 o Babies seen by providers at [facility] o Does NOT accept Medicaid o Please call early in hospitalization for appointment (limited availability)  . [facility] o Mulberry, MD o [address] [place], [place] [address] o [phone] o Mon, Tue, Thur, Fri 8:30-5:00, Wed 10:00-7:00 (closed 1-2pm) o Babies seen by [facility] providers o Accepting Medicaid . [facility] Pediatric[facility] o Rubin, MD o [address], [place], [place] [address] o [phone] o Mon-Fri 8:30-5:00, Sat 8:30-12:00 o Provider comes to see babies at [facility] o Accepting Medicaid o Must have been referred from current patients or contacted office prior to delivery . [facility] ([facility]) o Brown, MD; Chandler, MD; Ettefagh, MD; Grant, MD; Lester, MD; McCormick, MD; McQueen, MD; Prose, MD; Simha, MD; Stanley, MD; Stryffeler, NP; Tebben, NP o [address], [place], [place] [address] o [phone] o Mon, Tue, Thur, Fri 8:30-5:30, Wed 9:30-5:30, Sat 8:30-12:30 o Babies seen by [facility] providers o Accepting Medicaid o Only accepting infants of first-time parents or siblings of current patients o Hospital discharge coordinator will make follow-up appointment . Jack Amos o [address],  [place], [place]  [address] o [phone]   Fax - [phone] . [facility] o [address], [place], [place]  [address] o Phone - [phone]   Fax - [phone] . Shilpa Gosrani o [address], [place], [place]  [address] o [phone]  [place] ([address]) . [facility] o Bates, MD; [place], MD; Cooper, Cox, MD; MD; Davis, MD; Dovico, MD; Ettefaugh, MD; Little, MD; Lowe, MD; Keiffer, MD; Melvin, MD; Sumner, MD; Williams, MD o [address], [place], [place] [address] o [phone] o Mon-Fri 8:30-5:00 (extended evenings Mon-Thur as needed), Sat-Sun 10:00-1:00 o Providers come to see babies at [facility] o Accepting Medicaid for families of first-time babies and families with all children in the household age 3 and under. Must register with office prior to making appointment (M-F only). . [facility] o Henson, NP; Knapp, MD; Lalonde, MD; Tysinger, PA o [address] [place], [place] [address] o [phone] o Mon-Fri 8:00-5:00 o Babies seen by providers at [facility] o Does NOT accept Medicaid/Commercial Insurance Only . [facility] ([facility])  o Artis, MD; Barnes, MD; Bratton, MD; Coccaro, MD; Lockett Gardner, MD; Kramer, MD; Marshall, MD; Netherton, MD; Poleto, MD; Skinner, MD o [address] [place], [place] [address] o [phone] o Mon-Fri 8:30-5:30, Sat (Oct.-Mar.) 9:00-1:00 o Babies seen by providers at [facility] o Accepting Medicaid  [place] ([address]) . [facility] o Reid, MD; Warner, MD o [address], [place],  [address] o [phone] o Mon-Fri 8:30-5:00, Sat 8:30-12:00 o Providers come to see babies at [facility] o Does NOT accept Medicaid . [facility] o Becker, PA; Hagler, MD; Scifres, PA; Sun, MD; Swayne, MD o [address],  [place], [place] [address] o [phone] o Mon-Fri 8:00-5:00 o Babies seen by providers at [facility] o Does NOT accept Medicaid o Only accepting babies of parents who are patients o Please call early in hospitalization for appointment (limited availability) . [facility] o Clark, MD; Frye, MD; Kelleher, MD; Mack, NP; Miller, MD; O'Keller, MD; Patterson, NP; Pudlo, MD; Puzio, MD; Thomas, MD; Tucker, MD; Twiselton, MD o [address], [place], [place] [address] o [phone] o Mon-Fri 8:00-5:00, Sat 9:00-12:00 o Providers come to see babies at [facility] o Does NOT accept Medicaid  [place] ([address]) . [facility] o Limited providers accepting new patients: Brake, NP; Wharton, PA o [address], [place], [place] [address] o [phone] o Mon-Fri 8:00-5:00 o Babies seen by providers at [facility] o Does NOT accept Medicaid o Only accepting babies of parents who are patients o Please call early in hospitalization for appointment (limited availability) . [facility] o Gay, MD; Quinlan, MD o [address] [place], [place] [address] o [phone] (press 1 to schedule appointment) o Mon-Fri 8:00-5:00 o Providers come to see babies at [facility] o Does NOT accept Medicaid . [facility] o Mazer, MD o [address] [place], [place] [address] o [phone] o Mon-Fri 8:30-5:00 (lunch 12:30-1:00), extended hours by appointment only Wed 5:00-6:30 o Babies seen by [facility] providers o Accepting Medicaid . [facility] o Banks, MD; Jordan, MD; Koberlein, MD o [address], [place], [place] [address] o [phone] o Mon-Fri 8:00-5:00 o Babies seen by [facility] providers o Does NOT accept Medicaid . [facility] o Parker, MD; Hunter, MD; Wallace, DO o [address] [place], [place]  [address] o [phone] o Mon-Fri 8:00-5:00 o Babies seen by [facility] providers o Does NOT accept Medicaid . [facility] o Brandon, PA; Brecken, PA; Christy, NP; Dees, MD; DeClaire, MD; DeWeese, MD; Hansen, NP; Mills, NP; Parrish, NP; Smoot, NP; Summer, MD; Vapne, MD o [address] [place], [place] [address] o [phone] o Mon-Fri 8:30-5:00, Sat 10:00-1:00 o Providers come to see babies at [facility] o Does NOT accept Medicaid o Free prenatal information session Tuesdays at 4:45pm . [facility] o Bouska, MD; Gordon, PA; Jeffery, PA; Weber, PA o [address] [place] [address] o [phone] o Mon-Fri 7:30-5:30 o Babies seen by [facility] providers . [facility] o [address], [place], [place], [place]  [address] o [phone]   Fax - [phone]  [place] ([address] & [phone]) . [facility] o [place], [place] o [address] [place], [place] [address] o [phone] o Mon-Thur 8:00-6:00 o Providers come to see babies at [facility] o Accepting Medicaid . [facility] o Anderson, NP; Badger, MD; Beal, PA; Spencer, PA o [address] [place],  [address] o [phone] o Mon-Thur 7:30-7:30, Fri 7:30-4:30 o Babies seen by [facility] providers o Accepting Medicaid . [facility] o Agbuya, MD; Klett, NP; Romgoolam, MD o [address], ,  [address] o [phone] o Mon-Fri 8:30-5:00, Sat 8:30-12:00 o Providers come to see babies at [facility] o Accepting Medicaid o Must have "Meet & Greet" appointment at office prior to delivery . [facility]  ([facility] ) o McCord,   MD; Earlene Plater, MD; Lucretia Roers, MD o [address], [place], [place] [address] o [phone] o Mon-Wed 8:00-6:00, Thur-Fri 8:00-5:00, Sat 9:00-12:00 o Providers come to  see babies at [facility] o Does NOT accept Medicaid o Only accepting siblings of current patients . [facility]  o [address], [place], [place]  [address] o [phone]   Fax - [phone] . [facility] o [phone] [address], [place], [place]  [address] o [phone]   Fax - [phone]  [place] [phone] & [phone]) . Nature conservation officer at [facility] o Vermillion, DO; Wren, DO o [address] [place], [place] [address] o [phone] o Mon-Fri 7:00-5:00 o Babies seen by [facility] providers o Does NOT accept Medicaid . [facility] Ellis Savage, MD; [place], [place]; [place], [place] o [address], [place], [place] [address] o [phone] o Mon-Fri 8:00-5:00 o Babies seen by [facility] providers o Accepting Medicaid . [facility] - [address] Franne Forts, MD; [place], [place]; [place], NP; [place], [place] o [address] [place], [place], [place] [address] o [phone] o Mon-Fri 8:00-5:00 o Babies seen by providers at [facility] o Accepting [facility] [place] [phone]) .  [facility] at [facility] [place], [place] o [address] [place], [place] [address] o [phone] o Mon-Fri 8:00-5:00 o Babies seen by [facility] providers o Does NOT accept Medicaid o Limited availability, please call early in hospitalization to schedule follow-up . [facility] Jolee Ewing, PA; Eddie Candle, MD; [place], MD; [place], [place]; Constance Goltz, MD; [place], [place] o [address] [facility] [address], [place], [place] [address] o [phone] o Mon-Fri 8:30-5:00, Sat 9:00-12:00 o Babies seen by providers at [facility] o Accepting Medicaid o Please register online then schedule online or call office o [email or web address] . [facility]) Samuella Bruin, NP; Lucianne Muss, MD; Lanier Clam, PA o [address], [place], [place] [address] o [phone] o Mon-Fri 8:00-5:00 o Babies seen by providers at [facility] o Accepting Medicaid . [facility] ([facility]) Sharin Mons, MD; Reed Breech, NP; Shelva Majestic, MD o [address], [place], [place] [address] o [phone] o Mon-Fri 8:00-5:30, Sat&Sun by appointment (phones open at 8:30) o Babies seen by [facility] providers o Accepting Medicaid o Must be a first-time baby or sibling of current patient . [facility] [address], [place], [place]  [address] o [phone]   Fax - [phone]   For maternity band:   [facility]   [address] [place], [place], [place] [address] Phone: [phone]  Monday     8:30AM-5PM Tuesday 8:30AM-5PM Wednesday 8:30AM-5PM Thursday 8:30AM-5PM Friday  8:30AM-5PM Saturday Closed Sunday Closed

## 2019-12-08 LAB — PROTEIN / CREATININE RATIO, URINE
Creatinine, Urine: 132.5 mg/dL
Protein, Ur: 9.5 mg/dL
Protein/Creat Ratio: 72 mg/g creat (ref 0–200)

## 2019-12-08 LAB — COMPREHENSIVE METABOLIC PANEL
ALT: 4 IU/L (ref 0–32)
AST: 10 IU/L (ref 0–40)
Albumin/Globulin Ratio: 1.5 (ref 1.2–2.2)
Albumin: 3.5 g/dL — ABNORMAL LOW (ref 3.9–5.0)
Alkaline Phosphatase: 76 IU/L (ref 45–106)
BUN/Creatinine Ratio: 15 (ref 9–23)
BUN: 7 mg/dL (ref 6–20)
Bilirubin Total: 0.2 mg/dL (ref 0.0–1.2)
CO2: 20 mmol/L (ref 20–29)
Calcium: 8.8 mg/dL (ref 8.7–10.2)
Chloride: 105 mmol/L (ref 96–106)
Creatinine, Ser: 0.46 mg/dL — ABNORMAL LOW (ref 0.57–1.00)
GFR calc Af Amer: 166 mL/min/{1.73_m2} (ref >59)
GFR calc non Af Amer: 144 mL/min/{1.73_m2} (ref >59)
Globulin, Total: 2.4 g/dL (ref 1.5–4.5)
Glucose: 76 mg/dL (ref 65–99)
Potassium: 4.2 mmol/L (ref 3.5–5.2)
Sodium: 139 mmol/L (ref 134–144)
Total Protein: 5.9 g/dL — ABNORMAL LOW (ref 6.0–8.5)

## 2019-12-10 LAB — CBC/D/PLT+RPR+RH+ABO+RUB AB...
Antibody Screen: NEGATIVE
Basophils Absolute: 0 10*3/uL (ref 0.0–0.2)
Basos: 0 %
EOS (ABSOLUTE): 0.1 10*3/uL (ref 0.0–0.4)
Eos: 1 %
HCV Ab: <0.1 s/co ratio (ref 0.0–0.9)
HIV Screen 4th Generation wRfx: NONREACTIVE
Hematocrit: 36.3 % (ref 34.0–46.6)
Hemoglobin: 12.5 g/dL (ref 11.1–15.9)
Hepatitis B Surface Ag: NEGATIVE
Immature Grans (Abs): 0.1 10*3/uL (ref 0.0–0.1)
Immature Granulocytes: 1 %
Lymphocytes Absolute: 1.8 10*3/uL (ref 0.7–3.1)
Lymphs: 18 %
MCH: 31 pg (ref 26.6–33.0)
MCHC: 34.4 g/dL (ref 31.5–35.7)
MCV: 90 fL (ref 79–97)
Monocytes Absolute: 0.5 10*3/uL (ref 0.1–0.9)
Monocytes: 5 %
Neutrophils Absolute: 7.5 10*3/uL — ABNORMAL HIGH (ref 1.4–7.0)
Neutrophils: 75 %
Platelets: 306 10*3/uL (ref 150–450)
RBC: 4.03 x10E6/uL (ref 3.77–5.28)
RDW: 12.6 % (ref 11.7–15.4)
RPR Ser Ql: REACTIVE — AB
Rh Factor: POSITIVE
Rubella Antibodies, IGG: 2.54 index (ref >0.99)
WBC: 9.9 10*3/uL (ref 3.4–10.8)

## 2019-12-10 LAB — GC/CHLAMYDIA PROBE AMP (~~LOC~~) NOT AT ARMC
Chlamydia: NEGATIVE
Comment: NEGATIVE
Comment: NORMAL
Neisseria Gonorrhea: NEGATIVE

## 2019-12-10 LAB — RPR, QUANT+TP ABS (REFLEX)
Rapid Plasma Reagin, Quant: 1:2 {titer} — ABNORMAL HIGH
T Pallidum Abs: NONREACTIVE

## 2019-12-10 LAB — HCV INTERPRETATION

## 2019-12-11 ENCOUNTER — Encounter: Payer: Self-pay | Admitting: Medical

## 2019-12-11 DIAGNOSIS — Z8619 Personal history of other infectious and parasitic diseases: Secondary | ICD-10-CM | POA: Insufficient documentation

## 2019-12-14 ENCOUNTER — Other Ambulatory Visit: Payer: Self-pay | Admitting: *Deleted

## 2019-12-14 ENCOUNTER — Other Ambulatory Visit: Payer: Self-pay

## 2019-12-14 ENCOUNTER — Ambulatory Visit: Payer: Medicaid Other | Admitting: *Deleted

## 2019-12-14 ENCOUNTER — Ambulatory Visit: Payer: Medicaid Other | Attending: Medical

## 2019-12-14 DIAGNOSIS — O09292 Supervision of pregnancy with other poor reproductive or obstetric history, second trimester: Secondary | ICD-10-CM | POA: Insufficient documentation

## 2019-12-14 DIAGNOSIS — O0990 Supervision of high risk pregnancy, unspecified, unspecified trimester: Secondary | ICD-10-CM | POA: Insufficient documentation

## 2019-12-14 DIAGNOSIS — O99212 Obesity complicating pregnancy, second trimester: Secondary | ICD-10-CM

## 2019-12-14 DIAGNOSIS — Z3A23 23 weeks gestation of pregnancy: Secondary | ICD-10-CM

## 2019-12-14 DIAGNOSIS — O99210 Obesity complicating pregnancy, unspecified trimester: Secondary | ICD-10-CM

## 2019-12-14 DIAGNOSIS — O0992 Supervision of high risk pregnancy, unspecified, second trimester: Secondary | ICD-10-CM | POA: Insufficient documentation

## 2019-12-14 DIAGNOSIS — O09892 Supervision of other high risk pregnancies, second trimester: Secondary | ICD-10-CM

## 2019-12-14 DIAGNOSIS — F99 Mental disorder, not otherwise specified: Secondary | ICD-10-CM

## 2019-12-14 DIAGNOSIS — O99342 Other mental disorders complicating pregnancy, second trimester: Secondary | ICD-10-CM

## 2019-12-14 DIAGNOSIS — E669 Obesity, unspecified: Secondary | ICD-10-CM

## 2019-12-14 DIAGNOSIS — O0932 Supervision of pregnancy with insufficient antenatal care, second trimester: Secondary | ICD-10-CM

## 2019-12-14 IMAGING — US US MFM OB DETAIL+14 WK
1 series · 13 of 28 positions shown · non-contrast
Comparison: none

[Series 1: us mfm ob detail+14 wk · 13 of 90 slices shown]
[im 4/90]
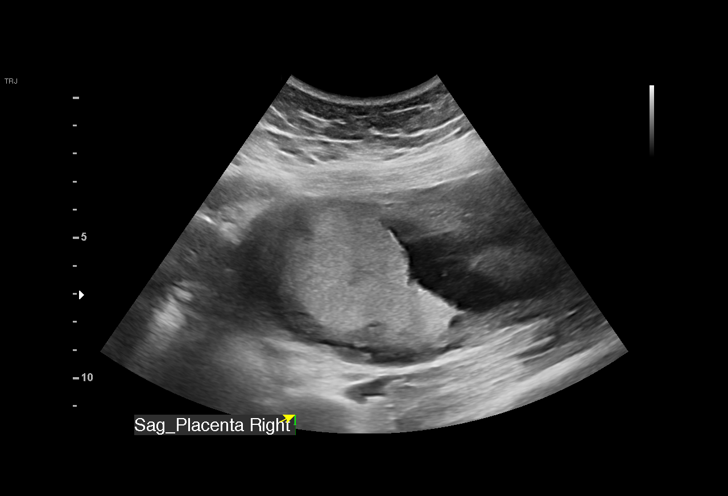
[im 10/90]
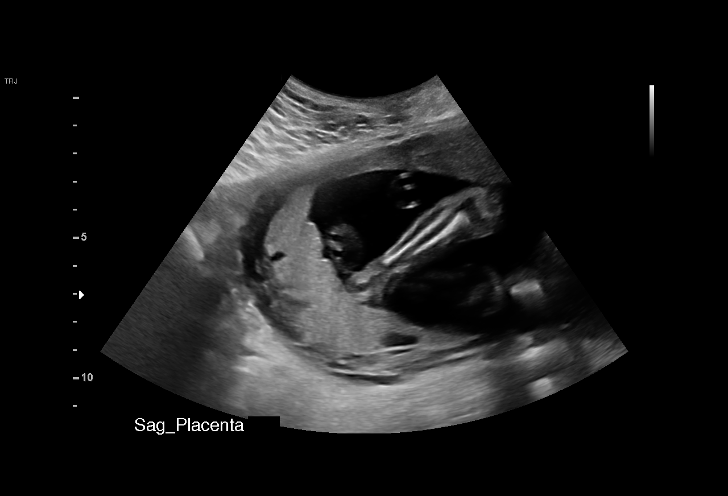
[im 17/90]
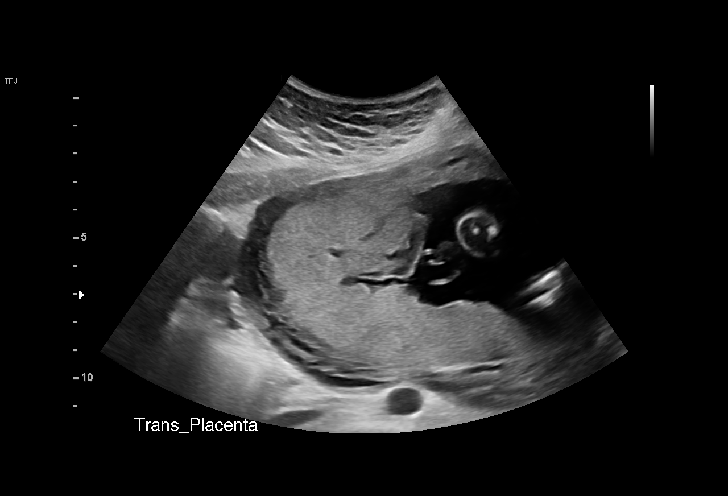
[im 24/90]
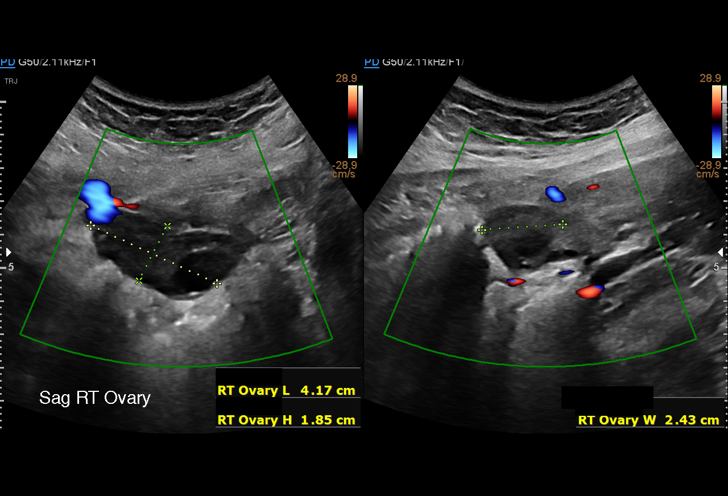
[im 30/90]
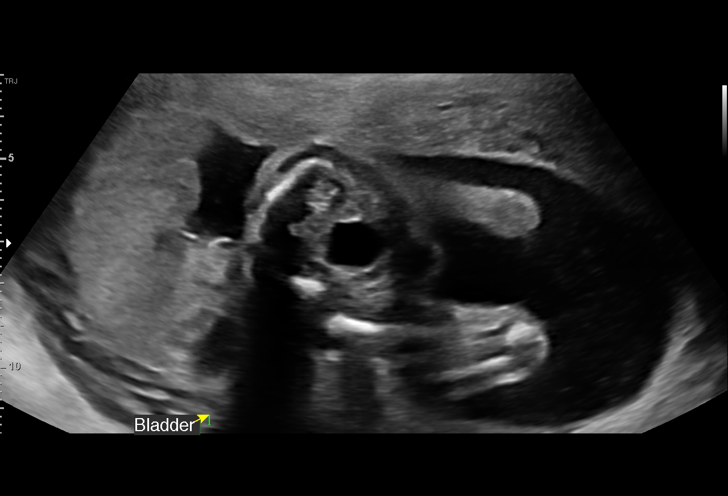
[im 37/90]
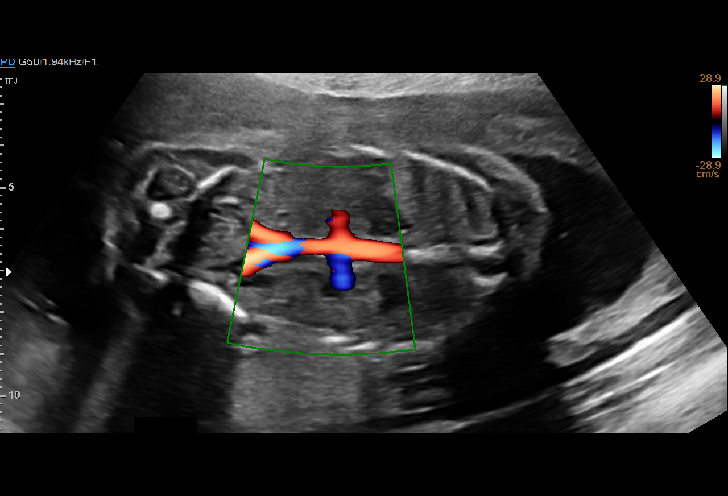
[im 47/90]
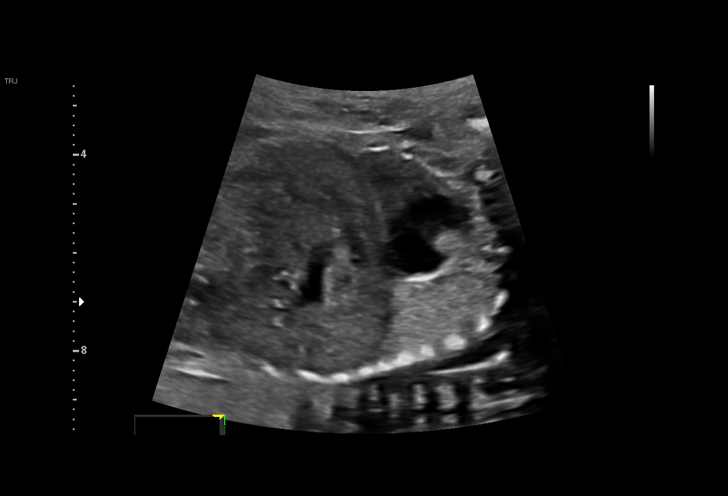
[im 53/90]
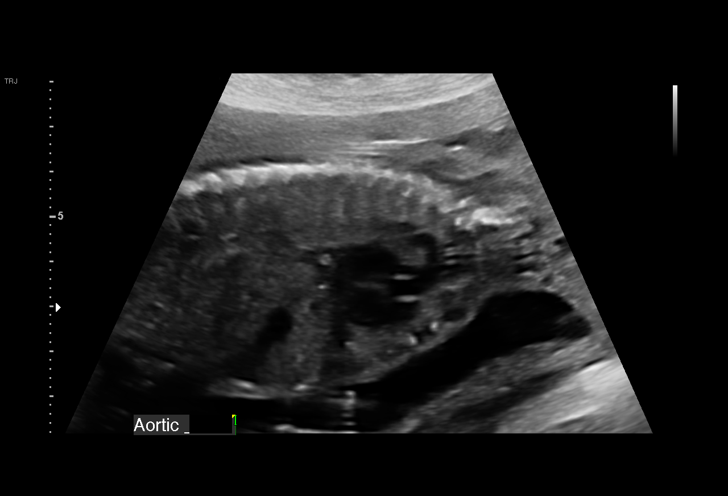
[im 60/90]
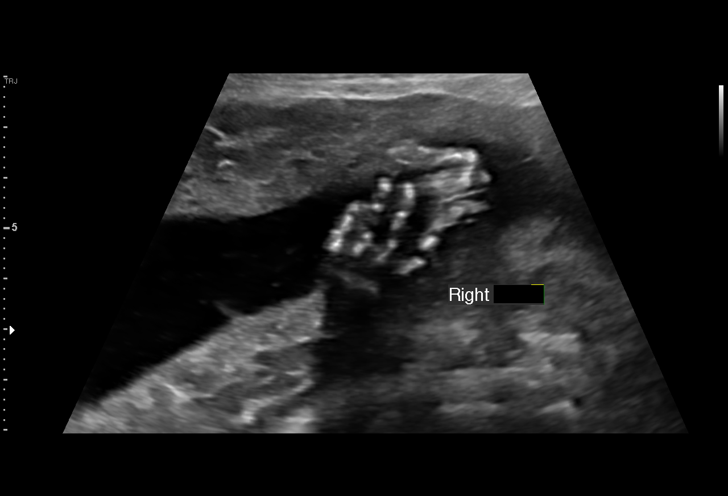
[im 66/90]
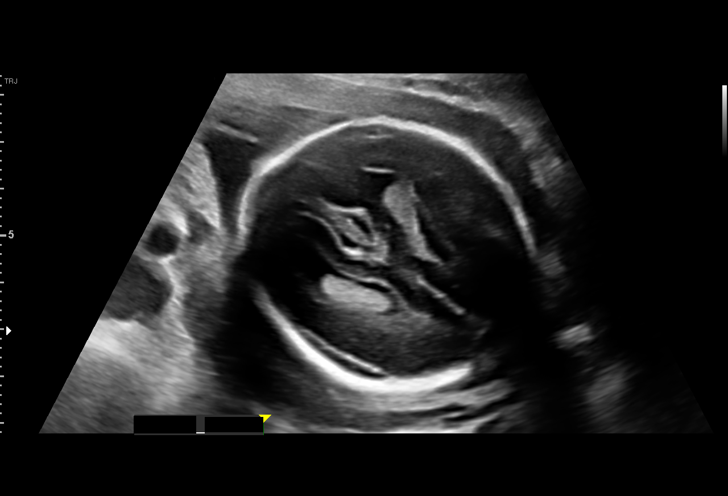
[im 73/90]
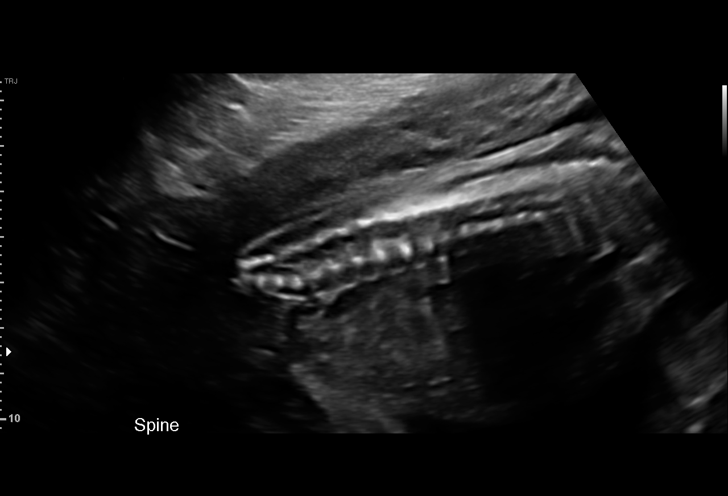
[im 80/90]
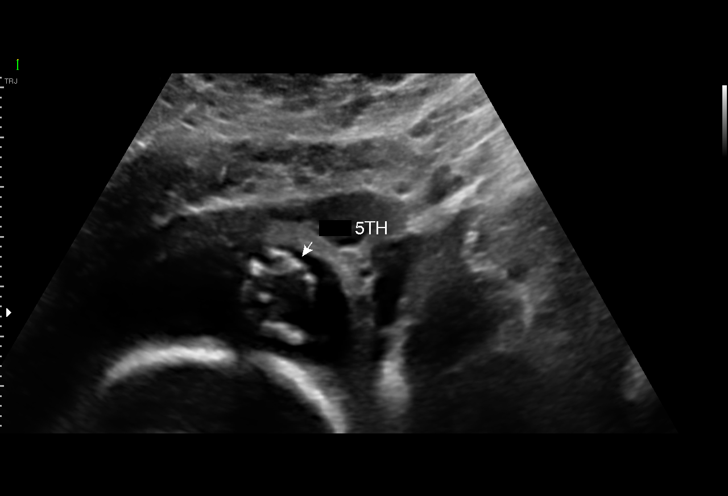
[im 86/90]
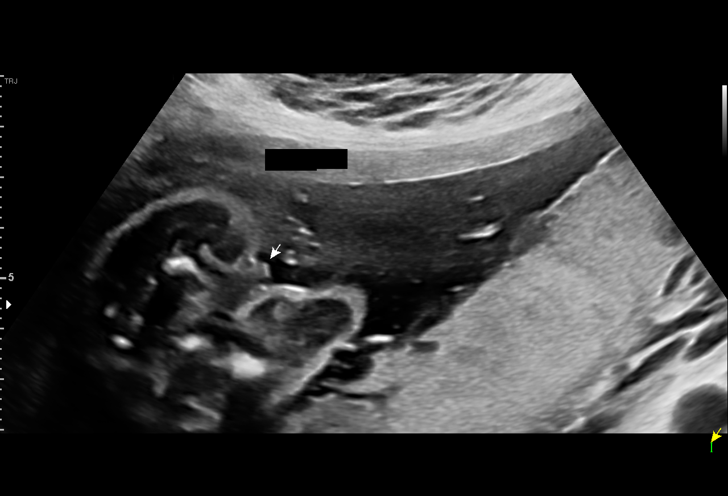

[13 of 28 positions shown; findings below may reference images not displayed]

Service(s) Provided

Indications

 Obesity complicating pregnancy, second
 trimester
 Other mental disorder complicating pregnancy,
 second trimester (odd,adhd,bipolar)
 Late prenatal care, second trimester
 Poor obstetric history: Previous preeclampsia
 Short interval between pregancies, 2nd
 trimester
 23 weeks gestation of pregnancy
Fetal Evaluation

 Num Of Fetuses:          1
 Fetal Heart Rate(bpm):   147
 Cardiac Activity:        Observed
 Presentation:            Cephalic
 Placenta:                Posterior Fundal
 P. Cord Insertion:       Visualized, central

 Amniotic Fluid
 AFI FV:      Within normal limits

                             Largest Pocket(cm)

Biometry
 BPD:      56.9   mm     G. Age:  23w 3d         49  %    CI:         73.18  %    70 - 86
                                                          FL/HC:       17.8  %    19.2 -
 HC:      211.4   mm     G. Age:  23w 1d         31  %    HC/AC:       1.17       1.05 -
 AC:        180   mm     G. Age:  22w 6d         28  %    FL/BPD:      66.3  %    71 - 87
 FL:       37.7   mm     G. Age:  22w 0d          8  %    FL/AC:       20.9  %    20 - 24
 HUM:      35.7   mm     G. Age:  22w 3d         23  %
 CER:      25.9   mm     G. Age:  23w 6d         58  %

 LV:         7.3  mm
 CM:          3   mm

 Est. FW:     518   gm      1 lb 2 oz     15  %
OB History

 Gravidity:     2         Term:  1
 Living:        1
Gestational Age

 LMP:            23w 2d       Date:  07/04/19                   EDD:  04/09/20
 U/S Today:      22w 6d                                         EDD:  04/12/20
 Best:           23w 2d    Det. By:  LMP  (07/04/19)            EDD:  04/09/20
Anatomy

 Cranium:                Appears normal         Aortic Arch:            Appears normal
 Cavum:                  Appears normal         Ductal Arch:            Appears normal
 Ventricles:             Appears normal         Diaphragm:              Appears normal
 Choroid Plexus:         Appears normal         Stomach:                Appears normal, left
                                                                        sided
 Cerebellum:             Appears normal         Abdomen:                Appears normal
 Posterior Fossa:        Appears normal         Abdominal Wall:         Appears nml (cord
                                                                        insert, abd wall)
 Nuchal Fold:            Not applicable (>20    Cord Vessels:           Appears normal (3
                         wks GA)                                        vessel cord)
 Face:                   Appears normal         Kidneys:                Appear normal
                         (orbits and profile)
 Lips:                   Appears normal         Bladder:                Appears normal
 Palate:                 Appears normal         Spine:                  Appears normal
 Heart:                  Appears normal         Upper Extremities:      Appears normal
                         (4CH, axis, and situs)
 RVOT:                   Appears normal         Lower Extremities:      Appears normal
 LVOT:                   Appears normal

 Other:   Heels and 5th digit visualized. Nasal bone visualized.
Cervix Uterus Adnexa

 Cervix
 Length:            4.75  cm.
 Normal appearance by transabdominal scan.

 Right Ovary
 Within normal limits.
 Left Ovary
 Within normal limits.
Impression

 Single intrauterine pregnancy with measurements consistent
 with dates
 Normal anatomy with good fetal movement and amniotic fluid
 Known BMI > 35
Recommendations

 Consider follow up growth between 28-32 weeks.

## 2019-12-21 ENCOUNTER — Encounter: Payer: Self-pay | Admitting: *Deleted

## 2019-12-24 ENCOUNTER — Telehealth (INDEPENDENT_AMBULATORY_CARE_PROVIDER_SITE_OTHER): Payer: Self-pay | Admitting: Medical

## 2019-12-24 DIAGNOSIS — O2690 Pregnancy related conditions, unspecified, unspecified trimester: Secondary | ICD-10-CM

## 2019-12-24 DIAGNOSIS — O219 Vomiting of pregnancy, unspecified: Secondary | ICD-10-CM

## 2019-12-24 DIAGNOSIS — R110 Nausea: Secondary | ICD-10-CM

## 2019-12-24 DIAGNOSIS — O0990 Supervision of high risk pregnancy, unspecified, unspecified trimester: Secondary | ICD-10-CM

## 2019-12-24 NOTE — Telephone Encounter (Signed)
Patient called into the office requesting a refill on the medicadtion ondanaetron. Patient stated that she only received 6 when she went to [facility] and she needs more.

## 2019-12-25 MED ORDER — ONDANSETRON 4 MG PO TBDP: 4.0000 mg | ORAL_TABLET | Freq: Three times a day (TID) | ORAL | 0 refills | Status: DC | PRN

## 2019-12-25 MED ORDER — DOCUSATE SODIUM 100 MG PO CAPS: 100.0000 mg | ORAL_CAPSULE | Freq: Two times a day (BID) | ORAL | 0 refills | Status: DC | PRN

## 2019-12-25 NOTE — Addendum Note (Signed)
Addended by: Marjo Bicker on: 12/25/2019 05:31 PM   Modules accepted: Orders

## 2019-12-25 NOTE — Telephone Encounter (Signed)
Called pt who states she has been nauseas since visit to the [facility] on 11/17/19. Pt states nausea has become worse in the last 2 weeks, reports vomiting once 1.5 weeks ago, and has had to take a Zofran tablet in the morning before eating breakfast. Reviewed with Vonzella Nipple, PA who gives verbal order for Zofran 4 mg every 8 hours, 30 tablets, no refills. Wenzel, PA recommends pt increase water intake and add a stool softener as Zofran can cause constipation. Provider recommendation given to patient and appropriate rx sent to pt's pharmacy.

## 2019-12-26 MED ORDER — ONDANSETRON 4 MG PO TBDP: 4.0000 mg | ORAL_TABLET | Freq: Three times a day (TID) | ORAL | 0 refills | Status: DC | PRN

## 2019-12-26 MED ORDER — DOCUSATE SODIUM 100 MG PO CAPS: 100.0000 mg | ORAL_CAPSULE | Freq: Two times a day (BID) | ORAL | 0 refills | Status: DC | PRN

## 2019-12-26 NOTE — Addendum Note (Signed)
Addended by: Maxwell Marion E on: 12/26/2019 01:20 PM   Modules accepted: Orders

## 2019-12-26 NOTE — Addendum Note (Signed)
Addended by: Maxwell Marion E on: 12/26/2019 01:30 PM   Modules accepted: Orders

## 2019-12-26 NOTE — Telephone Encounter (Addendum)
Pt called front office and requested medication be sent to [facility] [address] in [place]. Rx reordered. Called [facility] on [address] to discontinue rx ordered 12/25/19.

## 2020-01-04 ENCOUNTER — Ambulatory Visit (INDEPENDENT_AMBULATORY_CARE_PROVIDER_SITE_OTHER): Payer: Medicaid Other | Admitting: Medical

## 2020-01-04 ENCOUNTER — Other Ambulatory Visit: Payer: Medicaid Other

## 2020-01-04 ENCOUNTER — Other Ambulatory Visit: Payer: Self-pay

## 2020-01-04 ENCOUNTER — Encounter: Payer: Self-pay | Admitting: Medical

## 2020-01-04 VITALS — BP 114/73 | HR 91 | Wt 206.6 lb

## 2020-01-04 DIAGNOSIS — Z23 Encounter for immunization: Secondary | ICD-10-CM

## 2020-01-04 DIAGNOSIS — Z8759 Personal history of other complications of pregnancy, childbirth and the puerperium: Secondary | ICD-10-CM

## 2020-01-04 DIAGNOSIS — O0992 Supervision of high risk pregnancy, unspecified, second trimester: Secondary | ICD-10-CM

## 2020-01-04 DIAGNOSIS — O09892 Supervision of other high risk pregnancies, second trimester: Secondary | ICD-10-CM

## 2020-01-04 DIAGNOSIS — O0990 Supervision of high risk pregnancy, unspecified, unspecified trimester: Secondary | ICD-10-CM

## 2020-01-04 DIAGNOSIS — O0932 Supervision of pregnancy with insufficient antenatal care, second trimester: Secondary | ICD-10-CM | POA: Diagnosis not present

## 2020-01-04 DIAGNOSIS — Z3A26 26 weeks gestation of pregnancy: Secondary | ICD-10-CM

## 2020-01-04 DIAGNOSIS — Z8619 Personal history of other infectious and parasitic diseases: Secondary | ICD-10-CM

## 2020-01-04 DIAGNOSIS — O09292 Supervision of pregnancy with other poor reproductive or obstetric history, second trimester: Secondary | ICD-10-CM

## 2020-01-04 NOTE — Patient Instructions (Addendum)
Second Trimester of Pregnancy  The second trimester is from week 14 through week 27 (month 4 through 6). This is often the time in pregnancy that you feel your best. Often times, morning sickness has lessened or quit. You may have more energy, and you may get hungry more often. Your unborn baby is growing rapidly. At the end of the sixth month, he or she is about 9 inches long and weighs about 1 pounds. You will likely feel the baby move between 18 and 20 weeks of pregnancy. Follow these instructions at home: Medicines  Take over-the-counter and prescription medicines only as told by your doctor. Some medicines are safe and some medicines are not safe during pregnancy.  Take a prenatal vitamin that contains at least 600 micrograms (mcg) of folic acid.  If you have trouble pooping (constipation), take medicine that will make your stool soft (stool softener) if your doctor approves. Eating and drinking   Eat regular, healthy meals.  Avoid raw meat and uncooked cheese.  If you get low calcium from the food you eat, talk to your doctor about taking a daily calcium supplement.  Avoid foods that are high in fat and sugars, such as fried and sweet foods.  If you feel sick to your stomach (nauseous) or throw up (vomit): ? Eat 4 or 5 small meals a day instead of 3 large meals. ? Try eating a few soda crackers. ? Drink liquids between meals instead of during meals.  To prevent constipation: ? Eat foods that are high in fiber, like fresh fruits and vegetables, whole grains, and beans. ? Drink enough fluids to keep your pee (urine) clear or pale yellow. Activity  Exercise only as told by your doctor. Stop exercising if you start to have cramps.  Do not exercise if it is too hot, too humid, or if you are in a place of great height (high altitude).  Avoid heavy lifting.  Wear low-heeled shoes. Sit and stand up straight.  You can continue to have sex unless your doctor tells you not  to. Relieving pain and discomfort  Wear a good support bra if your breasts are tender.  Take warm water baths (sitz baths) to soothe pain or discomfort caused by hemorrhoids. Use hemorrhoid cream if your doctor approves.  Rest with your legs raised if you have leg cramps or low back pain.  If you develop puffy, bulging veins (varicose veins) in your legs: ? Wear support hose or compression stockings as told by your doctor. ? Raise (elevate) your feet for 15 minutes, 3-4 times a day. ? Limit salt in your food. Prenatal care  Write down your questions. Take them to your prenatal visits.  Keep all your prenatal visits as told by your doctor. This is important. Safety  Wear your seat belt when driving.  Make a list of emergency phone numbers, including numbers for family, friends, the hospital, and police and fire departments. General instructions  Ask your doctor about the right foods to eat or for help finding a counselor, if you need these services.  Ask your doctor about local prenatal classes. Begin classes before month 6 of your pregnancy.  Do not use hot tubs, steam rooms, or saunas.  Do not douche or use tampons or scented sanitary pads.  Do not cross your legs for long periods of time.  Visit your dentist if you have not done so. Use a soft toothbrush to brush your teeth. Floss gently.  Avoid all smoking, herbs,   and alcohol. Avoid drugs that are not approved by your doctor.  Do not use any products that contain nicotine or tobacco, such as cigarettes and e-cigarettes. If you need help quitting, ask your doctor.  Avoid cat litter boxes and soil used by cats. These carry germs that can cause birth defects in the baby and can cause a loss of your baby (miscarriage) or stillbirth. Contact a doctor if:  You have mild cramps or pressure in your lower belly.  You have pain when you pee (urinate).  You have bad smelling fluid coming from your vagina.  You continue to  feel sick to your stomach (nauseous), throw up (vomit), or have watery poop (diarrhea).  You have a nagging pain in your belly area.  You feel dizzy. Get help right away if:  You have a fever.  You are leaking fluid from your vagina.  You have spotting or bleeding from your vagina.  You have severe belly cramping or pain.  You lose or gain weight rapidly.  You have trouble catching your breath and have chest pain.  You notice sudden or extreme puffiness (swelling) of your face, hands, ankles, feet, or legs.  You have not felt the baby move in over an hour.  You have severe headaches that do not go away when you take medicine.  You have trouble seeing. Summary  The second trimester is from week 14 through week 27 (months 4 through 6). This is often the time in pregnancy that you feel your best.  To take care of yourself and your unborn baby, you will need to eat healthy meals, take medicines only if your doctor tells you to do so, and do activities that are safe for you and your baby.  Call your doctor if you get sick or if you notice anything unusual about your pregnancy. Also, call your doctor if you need help with the right food to eat, or if you want to know what activities are safe for you. This information is not intended to replace advice given to you by your health care provider. Make sure you discuss any questions you have with your health care provider. Document Revised: 10/13/2018 Document Reviewed: 07/27/2016 Elsevier Patient Education  2020 [employer] [phone]) . [facility] Davy Pique, MD; Gwendlyn Deutscher, MD; Walker Kehr, MD; Andria Frames, MD; McDiarmid, MD; Dutch Quint, MD; Nori Riis, MD; Mingo [address], [address] [place], [place] [address] o [phone] o Mon-Fri 8:30-12:30, 1:30-5:00 o Providers come to see babies at [facility] o Accepting Medicaid . [place]  [facility] [place] providers who accept newborns: Dorthy Cooler, MD; Orland Mustard, MD; Stephanie Acre, MD o [place], [place], [place] [address] o [phone] o Mon-Fri 8:00-5:30 o Babies seen by providers at [facility] o Does NOT accept Medicaid o Please call early in hospitalization for appointment (limited availability)  . Mustard Gallatin, MD o [address], [place], [place] [address] o [phone] o Mon, Tue, Thur, Fri 8:30-5:00, Wed 10:00-7:00 (closed 1-2pm) o Babies seen by [facility] providers o Accepting Medicaid . [place], MD o [place], [place], [place] [address] o [phone] o Mon-Fri 8:30-5:00, Sat 8:30-12:00 o Provider comes to see babies at [place] Medicaid o Must have been referred from current patients or contacted office prior to delivery . [facility] (  [facility]) o Brown, MD; Chandler, MD; Ettefagh, MD; Grant, MD; Lester, MD; McCormick, MD; McQueen, MD; Prose, MD; Simha, MD; Stanley, MD; Stryffeler, NP; Tebben, NP o [address], [place], [place] [address] o [phone] o Mon, Tue, Thur, Fri 8:30-5:30, Wed 9:30-5:30, Sat 8:30-12:30 o Babies seen by [facility] providers o Accepting Medicaid o Only accepting infants of first-time parents or siblings of current patients o Hospital discharge coordinator will make follow-up appointment . Jack Amos o [address], [place], [place]  [address] o [phone]   Fax - [phone] . [facility] o [address], [place], [place]  [address] o Phone - [phone]   Fax - [phone] . Shilpa Gosrani o [address], [place], [place]  [address] o [phone]  [place] ([address]) . [facility] o Bates, MD; [place], MD; Cooper, Cox, MD; MD; Davis, MD; Dovico, MD;  Ettefaugh, MD; Little, MD; Lowe, MD; Keiffer, MD; Melvin, MD; Sumner, MD; Williams, MD o [address], [place], [place] [address] o [phone] o Mon-Fri 8:30-5:00 (extended evenings Mon-Thur as needed), Sat-Sun 10:00-1:00 o Providers come to see babies at [facility] o Accepting Medicaid for families of first-time babies and families with all children in the household age 3 and under. Must register with office prior to making appointment (M-F only). . [facility] o Henson, NP; Knapp, MD; Lalonde, MD; Tysinger, PA o [address] [place], [place] [address] o [phone] o Mon-Fri 8:00-5:00 o Babies seen by providers at [facility] o Does NOT accept Medicaid/Commercial Insurance Only . [facility] [place] ([facility])  o Artis, MD; Barnes, MD; Bratton, MD; Coccaro, MD; [place] Gardner, MD; Kramer, MD; Marshall, MD; Netherton, MD; Poleto, MD; Skinner, MD o [address] [place], [place] [address] o [phone] o Mon-Fri 8:30-5:30, Sat (Oct.-Mar.) 9:00-1:00 o Babies seen by providers at [facility] o Accepting Medicaid  [place] ([address]) . [facility] o Reid, MD; Warner, MD o [address], [place], [place] [address] o [phone] o Mon-Fri 8:30-5:00, Sat 8:30-12:00 o Providers come to see babies at [facility] o Does NOT accept Medicaid . [facility] o Becker, PA; Hagler, MD; Scifres, PA; Sun, MD; Swayne, MD o [address], [place], [place] [address] o [phone] o Mon-Fri 8:00-5:00 o Babies seen by providers at [facility] o Does NOT accept Medicaid o Only accepting babies of parents who are patients o Please call early in hospitalization for appointment (limited availability) . [facility] o Clark, MD; Frye, MD; Kelleher, MD; Mack, NP; Miller, MD; O'Keller, MD; Patterson, NP; Pudlo, MD; Puzio, MD; Thomas, MD;  Tucker, MD; Twiselton, MD o [address], [place], [place] [address] o [phone] o Mon-Fri 8:00-5:00, Sat 9:00-12:00 o Providers come to see babies at [facility] o Does NOT accept Medicaid  [place] ([address]) . [facility] [employer] o Limited providers accepting new patients: Brake, NP; Wharton, PA o [address], [place], [place] [address] o [phone] o Mon-Fri 8:00-5:00 o Babies seen by providers at [facility] o Does NOT accept Medicaid o Only accepting babies of parents who are patients o Please call early in hospitalization for appointment (limited availability) . [facility] o Gay, MD; Quinlan, MD o [address] ,  [address] o [phone] (press 1 to schedule appointment) o Mon-Fri 8:00-5:00 o Providers come to   see babies at [facility] o Does NOT accept Medicaid . [facility] o Mazer, MD o [address] [place], [place] [address] o [phone] o Mon-Fri 8:30-5:00 (lunch 12:30-1:00), extended hours by appointment only Wed 5:00-6:30 o Babies seen by [facility] providers o Accepting Medicaid . [facility] o Banks, MD; Jordan, MD; Koberlein, MD o [address], [place], [place] [address] o [phone] o Mon-Fri 8:00-5:00 o Babies seen by [facility] providers o Does NOT accept Medicaid . [facility] o Parker, MD; Hunter, MD; Wallace, DO o [address] [place], [place] [address] o [phone] o Mon-Fri 8:00-5:00 o Babies seen by [facility] providers o Does NOT accept Medicaid . [facility] o Brandon, PA; Brecken, PA; Christy, NP; Dees, MD; DeClaire, MD; DeWeese, MD; Hansen, NP; Mills, NP; Parrish, NP; Smoot, NP; Summer, MD; Vapne, MD o [address] [place], [place] [address] o [phone] o Mon-Fri 8:30-5:00, Sat 10:00-1:00 o Providers come to see babies at [facility] o Does NOT accept Medicaid o Free prenatal information session Tuesdays at 4:45pm . [facility] o Bouska, MD; Gordon, PA; Jeffery, PA; Weber, PA o [address] [place] [address] o [phone] o Mon-Fri 7:30-5:30 o Babies seen by [facility] providers . [facility] o [address], [place], [place]  [address] o [phone]   Fax - [phone]  [place] ([address]) . [facility] o [place] o [address] [place], [place] [address] o [phone] o Mon-Thur 8:00-6:00 o Providers come to see babies at [facility] o Accepting Medicaid . [facility] o Anderson, NP; Badger, MD; Beal, PA; Spencer, PA o [address] [place], [place] [address] o [phone] o Mon-Thur 7:30-7:30, Fri 7:30-4:30 o Babies seen by [facility] providers o Accepting Medicaid . [facility] o Agbuya, MD; Klett, NP; Romgoolam, MD o [address], [place], [place] [address] o [phone] o Mon-Fri 8:30-5:00, Sat 8:30-12:00 o Providers come to see babies at [facility] o Accepting Medicaid o Must have "Meet & Greet" appointment at office prior to delivery . [facility] ([facility] o McCord, MD; Wallace, MD; Wood, MD o [address], [place], [place] [address] o [phone] o Mon-Wed 8:00-6:00, Thur-Fri 8:00-5:00, Sat 9:00-12:00 o Providers come to see babies at [facility] o Does NOT accept Medicaid o Only accepting siblings of current patients . [facility]  o [address], [place], [place]  [address] o [phone]   Fax - [phone] . [facility] [place] o [address], [place], [place]  [address] o [phone]   Fax - [phone]  [place] ([address]) . [facility] [facility] o Cirigliano, DO; Matthews, DO o [address] [place], [place] [address] o [phone] o Mon-Fri 7:00-5:00 o Babies seen by [facility] providers o Does NOT accept Medicaid . [facility] o Briscoe, MD; Howley, PA; [place], PA o [address], [place], [place] [address] o [phone] o Mon-Fri 8:00-5:00 o Babies seen by [facility] providers o Accepting Medicaid . [facility] o Boyd, MD; Church, PA; Jones, NP; Osborn, PA o [address], [place], [place] [address] o [phone] o Mon-Fri 8:00-5:00 o Babies seen by providers at [facility] o   Accepting Medicaid  [place] ([address]) . [facility] [place] o [place], DO o [address] [place], [place] [address] o [phone] o Mon-Fri 8:00-5:00 o Babies seen by [facility] providers o Does NOT accept Medicaid o Limited availability, please call early in hospitalization to schedule follow-up . [facility] o Calderon, PA; Cummings, MD; Dillard, MD; Martin, PA; Olson, MD; VanDeven, PA o [address], [place], [place] [address] o [phone] o Mon-Fri 8:30-5:00, Sat 9:00-12:00 o Babies seen by providers at [facility] o Accepting Medicaid o Please register online then schedule online or call office o [email or web address] . [facility] ([facility] o Hunter, NP; Kumar, MD; [place] Rogers, PA o [address], [place], [place] [address] o [phone] o Mon-Fri 8:00-5:00 o Babies seen by providers at [facility] o Accepting Medicaid . [facility] ([facility]) o Capitola, MD; Kristi Fleenor, NP; [place], MD o [address], [place], [place] [address] o [phone] o Mon-Fri 8:00-5:30, Sat&Sun by  appointment (phones open at 8:30) o Babies seen by [facility] providers o Accepting Medicaid o Must be a first-time baby or sibling of current patient . [facility] [place]  o [address], [place], [place]  [address] o [phone]   Fax - [phone]  [place] ([address]) . [facility] o Brown, PA; Cowen, PA; Rice, MD; Helton, PA; Spry, MD o [address] [place], [place] [address] o [phone] o Mon-Thur 8:00-7:00, Fri 8:00-5:00, Sat 8:00-12:00, Sun 9:00-12:00 o Babies seen by [facility] providers o Accepting Medicaid . [facility] [place] o Coe-Goins, MD; Marshall, MD; Pierre-Louis, MD o [address] [place] [address], [place], [place] [address] o [phone] o Mon-Thur 8:00-5:00 o Babies seen by providers at [facility] o Accepting Medicaid . [facility] o Bratton, MD; Coe-Goins, MD; Hayes, MD; Lewis, MD; List, MD; Lott, MD; Marshall, MD; Moran, MD; O'Neal, MD; Pierre-Louis, MD; Pitonzo, MD; Scholer, MD; Spangle, MD o [address] [place], [place] [address] o [phone] o Mon-Fri 8:00-5:30, Sat (Oct.-Mar.) 9:00-1:00 o Babies seen by providers at [facility] o Accepting Medicaid o Must fill out new patient packet, available online at [email or web address] . [facility] ([facility]) o Friddle, NP; Harris, NP; Kelly, NP; Logan, MD; Melvin, PA; Poth, MD; Ramadoss, MD; Stanton, NP o [address], [place], [place] [address] o [phone] o Mon-Thur 8:00-5:30, Fri 8:00-5:00 o Babies seen by providers at [facility] o Accepting Medicaid  [place] ([address]) . [facility] o Dixon, PA; Mead, MD; Pickard, MD; Tapia, PA o [address], [place], [place] [address] o [phone] o Mon-Fri 8:00-5:00 o Babies seen  by providers at [facility] o Accepting Medicaid   [place] ([address]) . [facility] [place] o Masneri, DO; Meyers, MD; Nelson, PA o [address], [place], [place] [address] o [phone] o Mon-Fri 8:00-5:00 o Babies seen by providers at [facility] o Does NOT accept Medicaid o Limited appointment availability, please call early in hospitalization  . [facility] [place] o Kunedd, DO; McGowen, MD o [address], [place],  [address] o [phone] o   Mon-Fri 8:00-5:00 o Babies seen by [facility] providers o Does NOT accept Medicaid . [facility] Lorrine Kin, MD; Ninetta Lights, MD; [place], [place]; [place], MD o [address], [place], [place] [address] o [phone] o Mon-Fri 8:00-5:00 o After hours clinic [facility] [place], [place] [address]) [phone] Mon-Fri 5:00-8:00, Sat 12:00-6:00, Sun 10:00-4:00 o Babies seen by [facility] providers o Accepting Medicaid . [facility] o [address], [place], [place]  [address] o [phone]   Fax - [phone]  [place] [phone]) . Nature conservation officer at [facility], MD o [address] [place], [place], [place] [address] o [phone] o Mon-Fri 8:00-5:00 o Babies seen by [facility] providers o Does NOT accept Medicaid . [facility] [place] [facility] [place]) Tomi Likens, MD o [address] [place] [address], [place], [place] [address] o [phone] o Mon-Thur 8:00-7:00, Fri 8:00-5:00, Sat 8:00-12:00 o Babies seen by providers at [facility] o Accepting Medicaid - but does not have vaccinations in office (must be received elsewhere) o Limited availability, please call early in hospitalization  [place] ([address]) . [facility]  o Wyvonne Lenz, MD o [address], [place] [address] o [phone]  Fax  [phone]  Considering [place] Dolin? Guide for patients at [facility] [employer] Why consider waterbirth? . Gentle birth for babies  . Less pain medicine used in labor  . May allow for passive descent/less pushing  . May reduce perineal tears  . More mobility and instinctive maternal position changes  . Increased maternal relaxation  . Reduced blood pressure in labor   Is waterbirth safe? What are the risks of infection, drowning or other complications? . Infection:  Marland Kitchen Very low risk (3.7 % for tub vs 4.8% for bed)  . 7 in 8000 waterbirths with documented infection  . Poorly cleaned equipment most common cause  . Slightly lower group B strep transmission rate  . Drowning  . Maternal:  . Very low risk  . Related to seizures or fainting  . Newborn:  Marland Kitchen Very low risk. No evidence of increased risk of respiratory problems in multiple large studies  . Physiological protection from breathing under water  . Avoid underwater birth if there are any fetal complications  . Once baby's head is out of the water, keep it out.  . Birth complication  . Some reports of cord trauma, but risk decreased by bringing baby to surface gradually  . No evidence of increased risk of shoulder dystocia. Mothers can usually change positions faster in water than in a bed, possibly aiding the maneuvers to free the shoulder.  ? You must attend a Waterbirth class at [employer] [facility] . 3rd Wednesday of every month from 7-9pm  . Free  . Register by calling [phone] or online at [email or web address]  . Bring [place] the certificate from the class to your prenatal appointment  Meet with a midwife at 36 weeks to see if you can still plan a waterbirth and to sign the consent.   If you plan a waterbirth at [facility] and [facility] at [facility], you can opt to purchase the following: . Fish Net . Bathing suit top (optional)  . Long-handled mirror (optional)  .  Things  that would prevent you from having a waterbirth: . Unknown or Positive COVID-19 diagnosis upon admission to hospital  . Premature, <37wks  . Previous  cesarean birth  . Presence of thick meconium-stained fluid  . Multiple gestation (Twins, triplets, etc.)  . Uncontrolled diabetes or gestational diabetes requiring medication  . Hypertension requiring medication or diagnosis of pre-eclampsia  . Heavy vaginal bleeding  . Non-reassuring fetal heart rate  . Active infection (MRSA, etc.). Group B Strep is NOT a contraindication for waterbirth.  . If your labor has to be induced and induction method requires continuous monitoring of the baby's heart rate  . Other risks/issues identified by your obstetrical provider  Please remember that birth is unpredictable. Under certain unforeseeable circumstances your provider may advise against giving birth in the tub. These decisions will be made on a case-by-case basis and with the safety of you and your baby as our highest priority.  **Please remember that in order to have a waterbirth, you must test Negative to COVID-19 upon admission to the hospital.**

## 2020-01-04 NOTE — Progress Notes (Signed)
   PRENATAL VISIT NOTE  Subjective:  Cheryl Harrell is a 19 y.o. G2P1001 at [redacted]w[redacted]d being seen today for ongoing prenatal care.  She is currently monitored for the following issues for this high-risk pregnancy and has Oppositional defiant disorder; Bipolar I disorder, most recent episode depressed, severe without psychotic features (HCC); Chronic post-traumatic stress disorder (PTSD); Attention deficit hyperactivity disorder (ADHD), combined type, moderate; Supervision of high risk pregnancy, antepartum; History of pre-eclampsia in prior pregnancy, currently pregnant in second trimester; Late prenatal care affecting pregnancy in second trimester; Short interval between pregnancies affecting pregnancy in second trimester, antepartum; and History of maternal syphilis, 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  Patient reports no complaints.  Contractions: Not present. Vag. Bleeding: None.  Movement: Present. Denies leaking of fluid.   The following portions of the patient's history were reviewed and updated as appropriate: allergies, current medications, past family history, past medical history, past social history, past surgical history and problem list.   Objective:   Vitals:   01/04/20 0925  BP: 114/73  Pulse: 91  Weight: 206 lb 9.6 oz (93.7 kg)    Fetal Status: Fetal Heart Rate (bpm): 147   Movement: Present     General:  Alert, oriented and cooperative. Patient is in no acute distress.  Skin: Skin is warm and dry. No rash noted.   Cardiovascular: Normal heart rate noted  Respiratory: Normal respiratory effort, no problems with respiration noted  Abdomen: Soft, gravid, appropriate for gestational age.  Pain/Pressure: Present     Pelvic: Cervical exam deferred        Extremities: Normal range of motion.  Edema: None  Mental Status: Normal mood and affect. Normal behavior. Normal judgment and thought content.   Assessment and Plan:  Pregnancy: G2P1001 at [redacted]w[redacted]d 1.  Supervision of high risk pregnancy, antepartum - 2 hour GTT, CBC, HIV and RPR today  - Tdap vaccine greater than or equal to 7yo IM - Still undecided about MOC, considering IUD  - Peds list given again  - Discussed weight gain of 31# at current, discussed healthy food choices including high protein, fruits and vegetables and avoiding excess or added sugar - Follow-up growth [place] on 01/18/20 due to BMI - Considering WB, will attempt to schedule with CNM soon to discuss further, class info given   2. History of pre-eclampsia in prior pregnancy, currently pregnant in second trimester - BASA  3. 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  - Last titer still 1:2 as previous which indicates no current/new infection   4. Late prenatal care affecting pregnancy in second trimester  5. Short interval between pregnancies affecting pregnancy in second trimester, antepartum  Preterm labor symptoms and general obstetric precautions including but not limited to vaginal bleeding, contractions, leaking of fluid and fetal movement were reviewed in detail with the patient. Please refer to After Visit Summary for other counseling recommendations.   Return in about 2 weeks (around 01/18/2020) for LOB, In-Person, with midwife if possible .  Future Appointments  Date Time Provider Department Center  01/23/2020  9:55 AM Marylen Ponto, NP [facility]  01/23/2020 10:45 AM [facility] NURSE [facility]  01/23/2020 10:45 AM [facility] US4 [facility]    Vonzella Nipple, PA-C

## 2020-01-04 NOTE — Progress Notes (Signed)
Medicaid Home form Completed-01/04/20

## 2020-01-08 ENCOUNTER — Encounter: Payer: Self-pay | Admitting: General Practice

## 2020-01-08 LAB — CBC
Hematocrit: 37.1 % (ref 34.0–46.6)
Hemoglobin: 12.9 g/dL (ref 11.1–15.9)
MCH: 31 pg (ref 26.6–33.0)
MCHC: 34.8 g/dL (ref 31.5–35.7)
MCV: 89 fL (ref 79–97)
Platelets: 309 10*3/uL (ref 150–450)
RBC: 4.16 x10E6/uL (ref 3.77–5.28)
RDW: 12.7 % (ref 11.7–15.4)
WBC: 10.4 10*3/uL (ref 3.4–10.8)

## 2020-01-08 LAB — GLUCOSE TOLERANCE, 2 HOURS W/ 1HR
Glucose, 1 hour: 90 mg/dL (ref 65–179)
Glucose, 2 hour: 77 mg/dL (ref 65–152)
Glucose, Fasting: 75 mg/dL (ref 65–91)

## 2020-01-08 LAB — HIV ANTIBODY (ROUTINE TESTING W REFLEX): HIV Screen 4th Generation wRfx: NONREACTIVE

## 2020-01-08 LAB — RPR: RPR Ser Ql: REACTIVE — AB

## 2020-01-08 LAB — RPR, QUANT+TP ABS (REFLEX)
Rapid Plasma Reagin, Quant: 1:2 {titer} — ABNORMAL HIGH
T Pallidum Abs: NONREACTIVE

## 2020-01-18 ENCOUNTER — Ambulatory Visit: Payer: Medicaid Other

## 2020-01-23 ENCOUNTER — Encounter: Payer: Self-pay | Admitting: Women's Health

## 2020-01-23 ENCOUNTER — Ambulatory Visit: Payer: Medicaid Other | Attending: Maternal & Fetal Medicine

## 2020-01-23 ENCOUNTER — Ambulatory Visit (INDEPENDENT_AMBULATORY_CARE_PROVIDER_SITE_OTHER): Payer: Medicaid Other | Admitting: Women's Health

## 2020-01-23 ENCOUNTER — Other Ambulatory Visit: Payer: Self-pay

## 2020-01-23 ENCOUNTER — Ambulatory Visit: Payer: Medicaid Other | Admitting: *Deleted

## 2020-01-23 VITALS — BP 118/68 | HR 75 | Wt 207.5 lb

## 2020-01-23 DIAGNOSIS — O0933 Supervision of pregnancy with insufficient antenatal care, third trimester: Secondary | ICD-10-CM

## 2020-01-23 DIAGNOSIS — O99343 Other mental disorders complicating pregnancy, third trimester: Secondary | ICD-10-CM

## 2020-01-23 DIAGNOSIS — O0990 Supervision of high risk pregnancy, unspecified, unspecified trimester: Secondary | ICD-10-CM

## 2020-01-23 DIAGNOSIS — O09293 Supervision of pregnancy with other poor reproductive or obstetric history, third trimester: Secondary | ICD-10-CM | POA: Diagnosis not present

## 2020-01-23 DIAGNOSIS — Z8619 Personal history of other infectious and parasitic diseases: Secondary | ICD-10-CM

## 2020-01-23 DIAGNOSIS — O09292 Supervision of pregnancy with other poor reproductive or obstetric history, second trimester: Secondary | ICD-10-CM

## 2020-01-23 DIAGNOSIS — O09893 Supervision of other high risk pregnancies, third trimester: Secondary | ICD-10-CM | POA: Diagnosis not present

## 2020-01-23 DIAGNOSIS — F319 Bipolar disorder, unspecified: Secondary | ICD-10-CM

## 2020-01-23 DIAGNOSIS — Z8759 Personal history of other complications of pregnancy, childbirth and the puerperium: Secondary | ICD-10-CM

## 2020-01-23 DIAGNOSIS — Z3A29 29 weeks gestation of pregnancy: Secondary | ICD-10-CM

## 2020-01-23 DIAGNOSIS — O09892 Supervision of other high risk pregnancies, second trimester: Secondary | ICD-10-CM

## 2020-01-23 DIAGNOSIS — F4312 Post-traumatic stress disorder, chronic: Secondary | ICD-10-CM

## 2020-01-23 DIAGNOSIS — E669 Obesity, unspecified: Secondary | ICD-10-CM

## 2020-01-23 DIAGNOSIS — O99210 Obesity complicating pregnancy, unspecified trimester: Secondary | ICD-10-CM | POA: Insufficient documentation

## 2020-01-23 DIAGNOSIS — O0993 Supervision of high risk pregnancy, unspecified, third trimester: Secondary | ICD-10-CM

## 2020-01-23 DIAGNOSIS — F99 Mental disorder, not otherwise specified: Secondary | ICD-10-CM

## 2020-01-23 DIAGNOSIS — O0932 Supervision of pregnancy with insufficient antenatal care, second trimester: Secondary | ICD-10-CM

## 2020-01-23 DIAGNOSIS — F902 Attention-deficit hyperactivity disorder, combined type: Secondary | ICD-10-CM

## 2020-01-23 DIAGNOSIS — O99213 Obesity complicating pregnancy, third trimester: Secondary | ICD-10-CM

## 2020-01-23 DIAGNOSIS — Z362 Encounter for other antenatal screening follow-up: Secondary | ICD-10-CM | POA: Diagnosis not present

## 2020-01-23 IMAGING — US US MFM OB FOLLOW-UP
1 series · 14 of 28 positions shown · non-contrast
Comparison: none

[Series 1: us mfm ob follow-up · 14 of 28 slices shown]
[im 2/28]
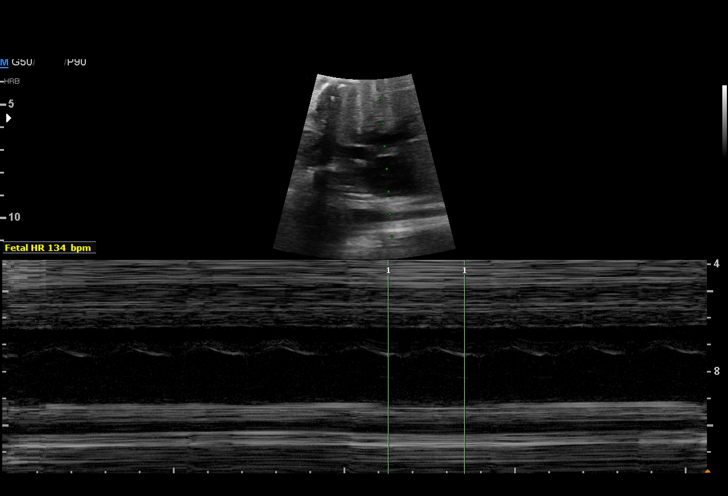
[im 4/28]
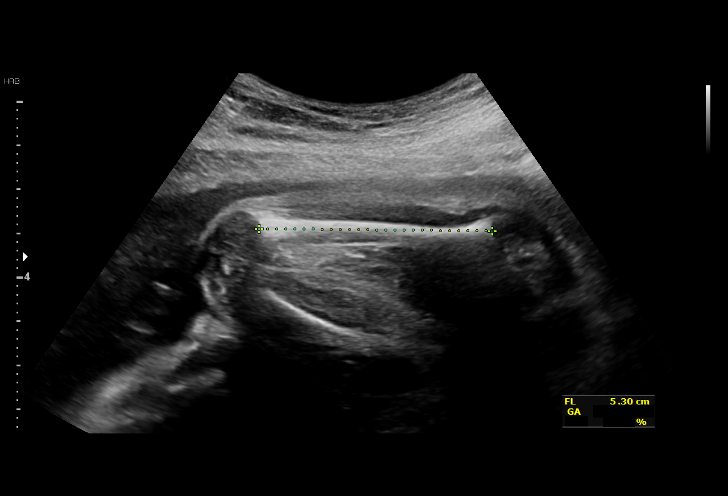
[im 6/28]
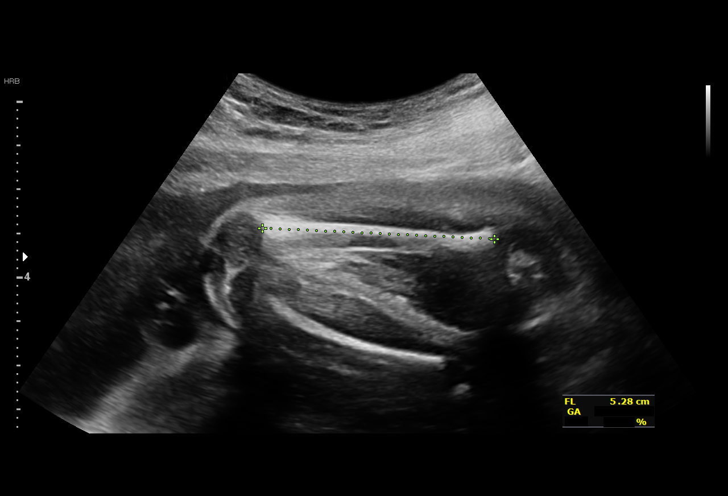
[im 8/28]
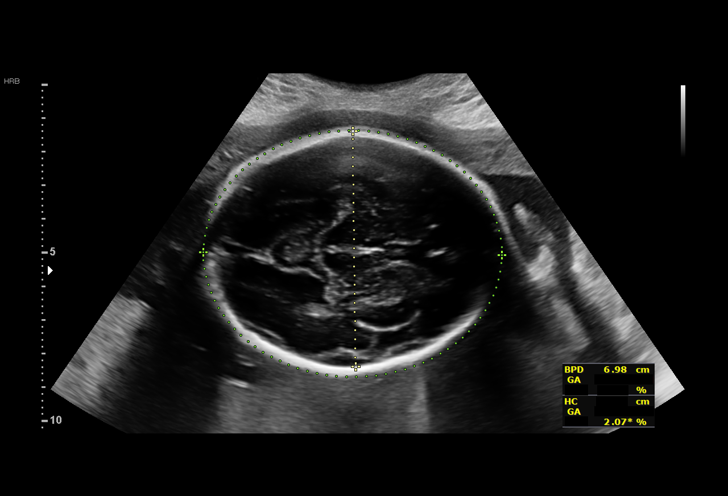
[im 10/28]
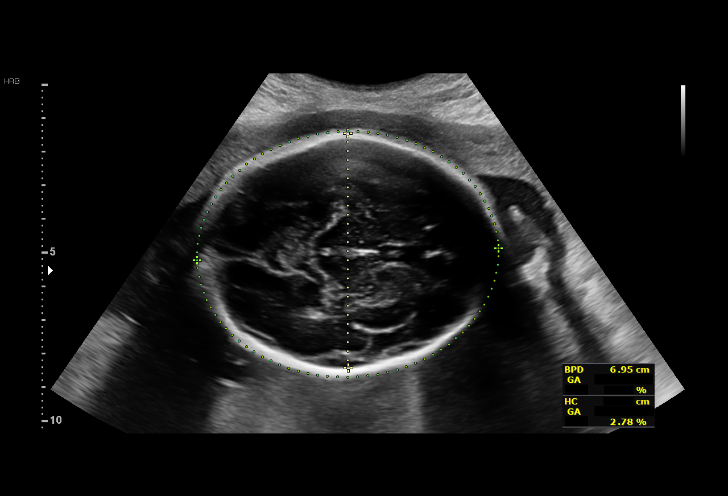
[im 12/28]
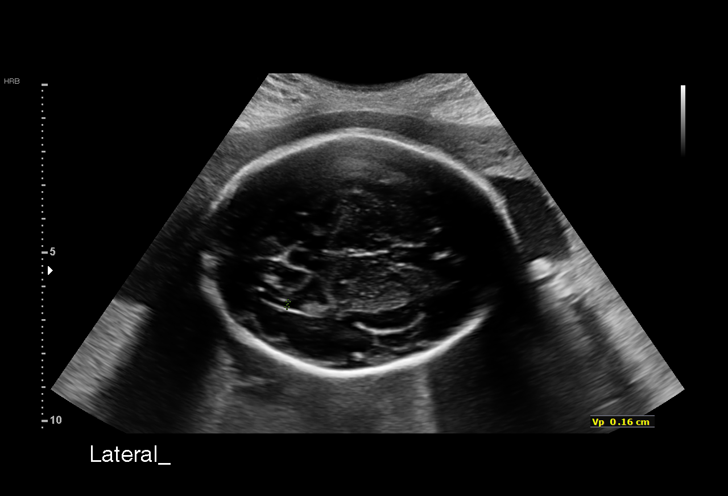
[im 14/28]
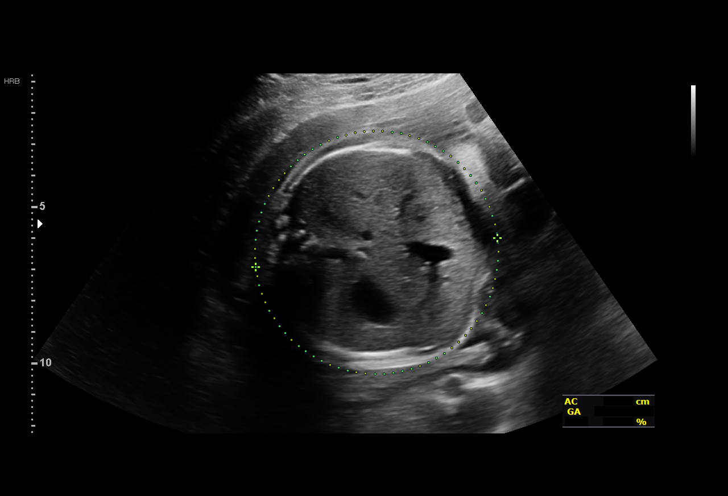
[im 16/28]
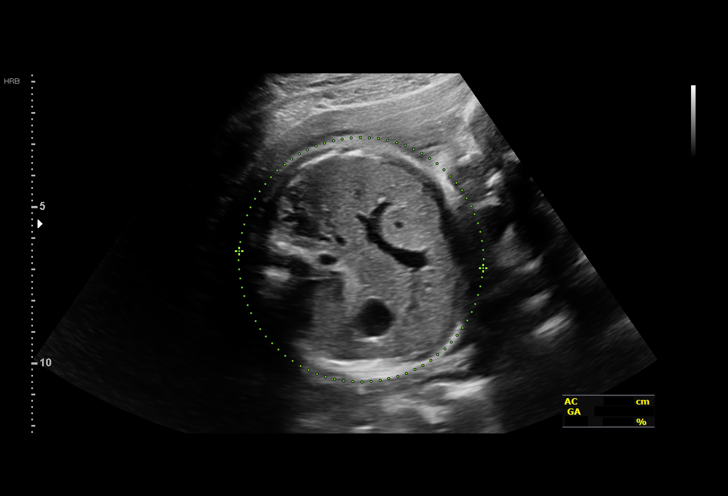
[im 18/28]
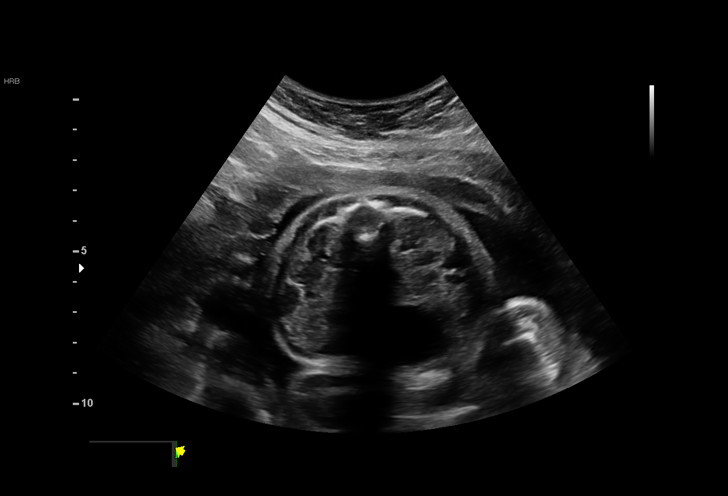
[im 20/28]
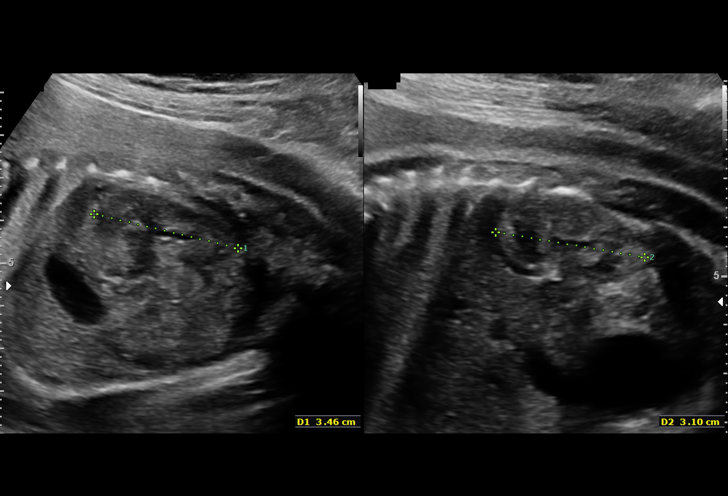
[im 22/28]
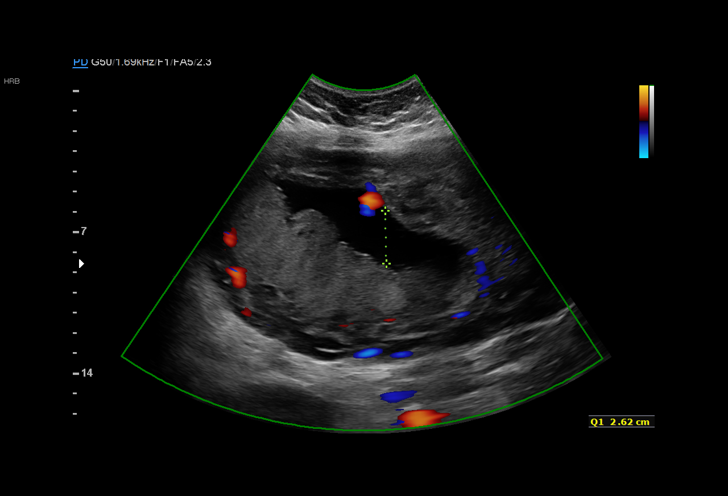
[im 24/28]
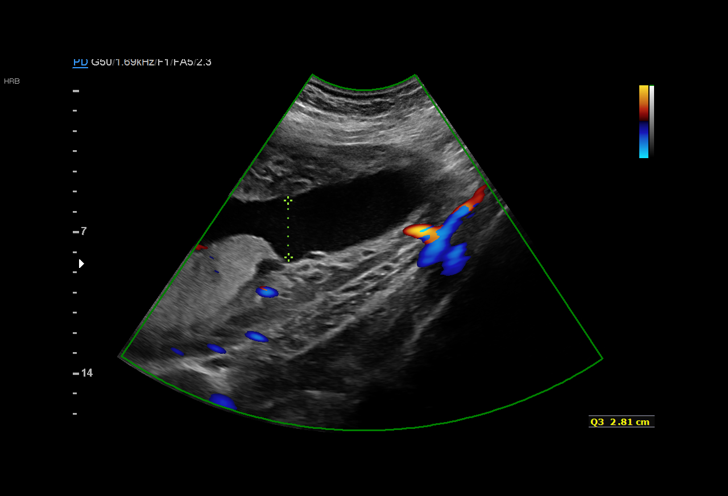
[im 26/28]
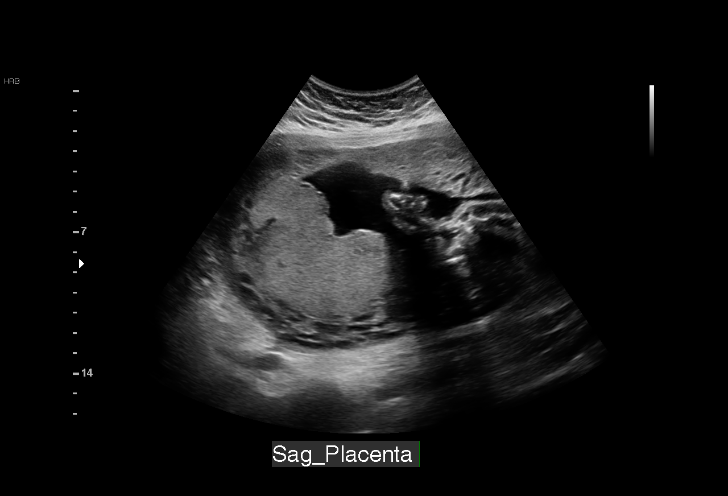
[im 28/28]
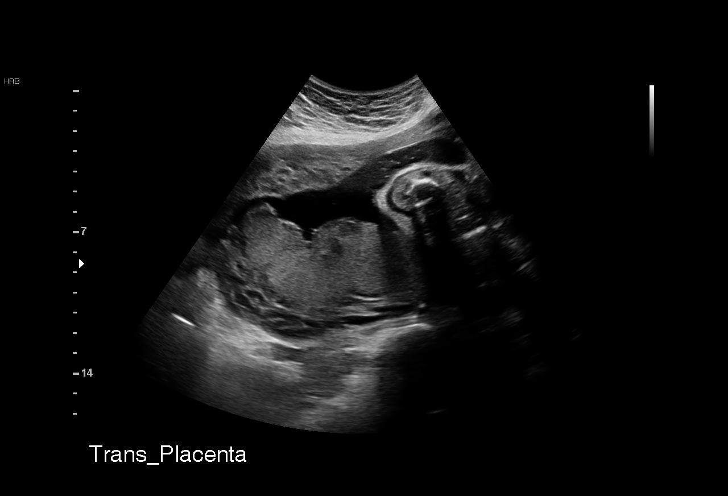

[14 of 28 positions shown; findings below may reference images not displayed]

PANG

Indications

 Obesity complicating pregnancy, second
 trimester BMI >35
 Other mental disorder complicating
 pregnancy, second trimester
 (odd,adhd,bipolar)
 Late prenatal care, second trimester
 Poor obstetric history: Previous preeclampsia
 Short interval between pregancies, 3rd
 trimester
 29 weeks gestation of pregnancy
 Encounter for other antenatal screening
 follow-up (low risk NIPS)
Fetal Evaluation

 Num Of Fetuses:          1
 Fetal Heart Rate(bpm):   134
 Cardiac Activity:        Observed
 Presentation:            Breech
 Placenta:                Posterior Fundal
 P. Cord Insertion:       Previously Visualized

 Amniotic Fluid
 AFI FV:      Within normal limits

 AFI Sum(cm)     %Tile       Largest Pocket(cm)
 11.6            25

 RUQ(cm)       RLQ(cm)       LUQ(cm)        LLQ(cm)

Biometry

 BPD:      69.9  mm     G. Age:  28w 1d         13  %    CI:        74.68   %    70 - 86
                                                         FL/HC:       20.6  %    19.6 -
 HC:      256.7  mm     G. Age:  27w 6d          3  %    HC/AC:       1.05       0.99 -
 AC:      244.8  mm     G. Age:  28w 5d         35  %    FL/BPD:      75.8  %    71 - 87
 FL:         53  mm     G. Age:  28w 1d         15  %    FL/AC:       21.7  %    20 - 24
 LV:        1.6  mm

 Est. FW:    5119   gm   2 lb 11 oz      19  %
OB History

 Gravidity:    2         Term:   1
 Living:       1
Gestational Age

 LMP:           29w 0d        Date:  07/04/19                 EDD:   04/09/20
 U/S Today:     28w 2d                                        EDD:   04/14/20
 Best:          29w 0d     Det. By:  LMP  (07/04/19)          EDD:   04/09/20
Anatomy

 Cranium:               Appears normal         Aortic Arch:            Previously seen
 Cavum:                 Appears normal         Ductal Arch:            Previously seen
 Ventricles:            Appears normal         Diaphragm:              Previously seen
 Choroid Plexus:        Previously seen        Stomach:                Appears normal, left
                                                                       sided
 Cerebellum:            Previously seen        Abdomen:                Appears normal
 Posterior Fossa:       Previously seen        Abdominal Wall:         Previously seen
 Nuchal Fold:           Not applicable (>20    Cord Vessels:           Previously seen
                        wks GA)
 Face:                  Orbits and profile     Kidneys:                Appear normal
                        previously seen
 Lips:                  Previously seen        Bladder:                Appears normal
 Palate:                Previously seen        Spine:                  Previously seen
 Heart:                 Appears normal         Upper Extremities:      Previously seen
                        (4CH, axis, and
                        situs)
 RVOT:                  Previously seen        Lower Extremities:      Previously seen
 LVOT:                  Previously seen

 Other:  Heels and 5th digit previously visualized. Nasal bone previously
         visualized.
Cervix Uterus Adnexa

 Cervix
 Not visualized (advanced GA >70wks)
Impression

 Single intrauterine pregnancy her for follow up growth and
 anatomy
 Normal interval growth with measurments consistent with
 dates
 Good fetal movement and amniotic fluid volume.
Recommendations

 Follow up as clinically indicated.

## 2020-01-23 NOTE — Progress Notes (Signed)
Pt states still having back problems, back brace has not been helping.

## 2020-01-23 NOTE — Patient Instructions (Addendum)
Maternity Assessment Unit (MAU)  The Maternity Assessment Unit (MAU) is located at the [facility] and [place] at [facility]. The address is: [address] [place], [place], [place] [address]. Please see map below for additional directions.    The Maternity Assessment Unit is designed to help you during your pregnancy, and for up to 6 weeks after delivery, with any pregnancy- or postpartum-related emergencies, if you think you are in labor, or if your water has broken. For example, if you experience nausea and vomiting, vaginal bleeding, severe abdominal or pelvic pain, elevated blood pressure or other problems related to your pregnancy or postpartum time, please come to the Maternity Assessment Unit for assistance.      Preterm Labor and Birth Information  The normal length of a pregnancy is 39-41 weeks. Preterm labor is when labor starts before 37 completed weeks of pregnancy. What are the risk factors for preterm labor? Preterm labor is more likely to occur in women who:  Have certain infections during pregnancy such as a bladder infection, sexually transmitted infection, or infection inside the uterus (chorioamnionitis).  Have a shorter-than-normal cervix.  Have gone into preterm labor before.  Have had surgery on their cervix.  Are younger than age 25 or older than age 60.  Are African American.  Are pregnant with twins or multiple babies (multiple gestation).  Take street drugs or smoke while pregnant.  Do not gain enough weight while pregnant.  Became pregnant shortly after having been pregnant. What are the symptoms of preterm labor? Symptoms of preterm labor include:  Cramps similar to those that can happen during a menstrual period. The cramps may happen with diarrhea.  Pain in the abdomen or lower back.  Regular uterine contractions that may feel like tightening of the abdomen.  A feeling of increased pressure in the  pelvis.  Increased watery or bloody mucus discharge from the vagina.  Water breaking (ruptured amniotic sac). Why is it important to recognize signs of preterm labor? It is important to recognize signs of preterm labor because babies who are born prematurely may not be fully developed. This can put them at an increased risk for:  Long-term (chronic) heart and lung problems.  Difficulty immediately after birth with regulating body systems, including blood sugar, body temperature, heart rate, and breathing rate.  Bleeding in the brain.  Cerebral palsy.  Learning difficulties.  Death. These risks are highest for babies who are born before 33 weeks of pregnancy. How is preterm labor treated? Treatment depends on the length of your pregnancy, your condition, and the health of your baby. It may involve:  Having a stitch (suture) placed in your cervix to prevent your cervix from opening too early (cerclage).  Taking or being given medicines, such as: ? Hormone medicines. These may be given early in pregnancy to help support the pregnancy. ? Medicine to stop contractions. ? Medicines to help mature the baby's lungs. These may be prescribed if the risk of delivery is high. ? Medicines to prevent your baby from developing cerebral palsy. If the labor happens before 34 weeks of pregnancy, you may need to stay in the hospital. What should I do if I think I am in preterm labor? If you think that you are going into preterm labor, call your health care provider right away. How can I prevent preterm labor in future pregnancies? To increase your chance of having a full-term pregnancy:  Do not use any tobacco products, such as cigarettes, chewing  cigarettes, chewing tobacco, and e-cigarettes. If you need help quitting, ask your health care provider.  Do not use street drugs or medicines that have not been prescribed to you during your pregnancy.  Talk with your health care provider before taking any herbal  supplements, even if you have been taking them regularly.  Make sure you gain a healthy amount of weight during your pregnancy.  Watch for infection. If you think that you might have an infection, get it checked right away.  Make sure to tell your health care provider if you have gone into preterm labor before. This information is not intended to replace advice given to you by your health care provider. Make sure you discuss any questions you have with your health care provider. Document Revised: 10/13/2018 Document Reviewed: 11/12/2015 Elsevier Patient Education  2020 Elsevier Inc.        Preeclampsia and Eclampsia Preeclampsia is a serious condition that may develop during pregnancy. This condition causes high blood pressure and increased protein in your urine along with other symptoms, such as headaches and vision changes. These symptoms may develop as the condition gets worse. Preeclampsia may occur at 20 weeks of pregnancy or later. Diagnosing and treating preeclampsia early is very important. If not treated early, it can cause serious problems for you and your baby. One problem it can lead to is eclampsia. Eclampsia is a condition that causes muscle jerking or shaking (convulsions or seizures) and other serious problems for the mother. During pregnancy, delivering your baby may be the best treatment for preeclampsia or eclampsia. For most women, preeclampsia and eclampsia symptoms go away after giving birth. In rare cases, a woman may develop preeclampsia after giving birth (postpartum preeclampsia). This usually occurs within 48 hours after childbirth but may occur up to 6 weeks after giving birth. What are the causes? The cause of preeclampsia is not known. What increases the risk? The following risk factors make you more likely to develop preeclampsia:  Being pregnant for the first time.  Having had preeclampsia during a past pregnancy.  Having a family history of  preeclampsia.  Having high blood pressure.  Being pregnant with more than one baby.  Being 35 or older.  Being African-American.  Having kidney disease or diabetes.  Having medical conditions such as lupus or blood diseases.  Being very overweight (obese). What are the signs or symptoms? The most common symptoms are:  Severe headaches.  Vision problems, such as blurred or double vision.  Abdominal pain, especially upper abdominal pain. Other symptoms that may develop as the condition gets worse include:  Sudden weight gain.  Sudden swelling of the hands, face, legs, and feet.  Severe nausea and vomiting.  Numbness in the face, arms, legs, and feet.  Dizziness.  Urinating less than usual.  Slurred speech.  Convulsions or seizures. How is this diagnosed? There are no screening tests for preeclampsia. Your health care provider will ask you about symptoms and check for signs of preeclampsia during your prenatal visits. You may also have tests that include:  Checking your blood pressure.  Urine tests to check for protein. Your health care provider will check for this at every prenatal visit.  Blood tests.  Monitoring your baby's heart rate.  Ultrasound. How is this treated? You and your health care provider will determine the treatment approach that is best for you. Treatment may include:  Having more frequent prenatal exams to check for signs of preeclampsia, if you have an increased risk for   preeclampsia.  Medicine to lower your blood pressure.  Staying in the hospital, if your condition is severe. There, treatment will focus on controlling your blood pressure and the amount of fluids in your body (fluid retention).  Taking medicine (magnesium sulfate) to prevent seizures. This may be given as an injection or through an IV.  Taking a low-dose aspirin during your pregnancy.  Delivering your baby early. You may have your labor started with medicine  (induced), or you may have a cesarean delivery. Follow these instructions at home: Eating and drinking   Drink enough fluid to keep your urine pale yellow.  Avoid caffeine. Lifestyle  Do not use any products that contain nicotine or tobacco, such as cigarettes and e-cigarettes. If you need help quitting, ask your health care provider.  Do not use alcohol or drugs.  Avoid stress as much as possible. Rest and get plenty of sleep. General instructions  Take over-the-counter and prescription medicines only as told by your health care provider.  When lying down, lie on your left side. This keeps pressure off your major blood vessels.  When sitting or lying down, raise (elevate) your feet. Try putting some pillows underneath your lower legs.  Exercise regularly. Ask your health care provider what kinds of exercise are best for you.  Keep all follow-up and prenatal visits as told by your health care provider. This is important. How is this prevented? There is no known way of preventing preeclampsia or eclampsia from developing. However, to lower your risk of complications and detect problems early:  Get regular prenatal care. Your health care provider may be able to diagnose and treat the condition early.  Maintain a healthy weight. Ask your health care provider for help managing weight gain during pregnancy.  Work with your health care provider to manage any long-term (chronic) health conditions you have, such as diabetes or kidney problems.  You may have tests of your blood pressure and kidney function after giving birth.  Your health care provider may have you take low-dose aspirin during your next pregnancy. Contact a health care provider if:  You have symptoms that your health care provider told you may require more treatment or monitoring, such as: ? Headaches. ? Nausea or vomiting. ? Abdominal pain. ? Dizziness. ? Light-headedness. Get help right away if:  You have  severe: ? Abdominal pain. ? Headaches that do not get better. ? Dizziness. ? Vision problems. ? Confusion. ? Nausea or vomiting.  You have any of the following: ? A seizure. ? Sudden, rapid weight gain. ? Sudden swelling in your hands, ankles, or face. ? Trouble moving any part of your body. ? Numbness in any part of your body. ? Trouble speaking. ? Abnormal bleeding.  You faint. Summary  Preeclampsia is a serious condition that may develop during pregnancy.  This condition causes high blood pressure and increased protein in your urine along with other symptoms, such as headaches and vision changes.  Diagnosing and treating preeclampsia early is very important. If not treated early, it can cause serious problems for you and your baby.  Get help right away if you have symptoms that your health care provider told you to watch for. This information is not intended to replace advice given to you by your health care provider. Make sure you discuss any questions you have with your health care provider. Document Revised: 02/21/2018 Document Reviewed: 01/26/2016 Elsevier Patient Education  2020 Elsevier Inc.        Contraception Choices -   [email or web address] Contraception, also called birth control, refers to methods or devices that prevent pregnancy. Hormonal methods Contraceptive implant  A contraceptive implant is a thin, plastic tube that contains a hormone. It is inserted into the upper part of the arm. It can remain in place for up to 3 years. Progestin-only injections Progestin-only injections are injections of progestin, a synthetic form of the hormone progesterone. They are given every 3 months by a health care provider. Birth control pills  Birth control pills are pills that contain hormones that prevent pregnancy. They must be taken once a day, preferably at the same time each day. Birth control patch  The birth control patch contains hormones that prevent  pregnancy. It is placed on the skin and must be changed once a week for three weeks and removed on the fourth week. A prescription is needed to use this method of contraception. Vaginal ring  A vaginal ring contains hormones that prevent pregnancy. It is placed in the vagina for three weeks and removed on the fourth week. After that, the process is repeated with a new ring. A prescription is needed to use this method of contraception. Emergency contraceptive Emergency contraceptives prevent pregnancy after unprotected sex. They come in pill form and can be taken up to 5 days after sex. They work best the sooner they are taken after having sex. Most emergency contraceptives are available without a prescription. This method should not be used as your only form of birth control. Barrier methods Female condom  A female condom is a thin sheath that is worn over the penis during sex. Condoms keep sperm from going inside a woman's body. They can be used with a spermicide to increase their effectiveness. They should be disposed after a single use. Female condom  A female condom is a soft, loose-fitting sheath that is put into the vagina before sex. The condom keeps sperm from going inside a woman's body. They should be disposed after a single use. Diaphragm  A diaphragm is a soft, dome-shaped barrier. It is inserted into the vagina before sex, along with a spermicide. The diaphragm blocks sperm from entering the uterus, and the spermicide kills sperm. A diaphragm should be left in the vagina for 6-8 hours after sex and removed within 24 hours. A diaphragm is prescribed and fitted by a health care provider. A diaphragm should be replaced every 1-2 years, after giving birth, after gaining more than 15 lb (6.8 kg), and after pelvic surgery. Cervical cap  A cervical cap is a round, soft latex or plastic cup that fits over the cervix. It is inserted into the vagina before sex, along with spermicide. It blocks  sperm from entering the uterus. The cap should be left in place for 6-8 hours after sex and removed within 48 hours. A cervical cap must be prescribed and fitted by a health care provider. It should be replaced every 2 years. Sponge  A sponge is a soft, circular piece of polyurethane foam with spermicide on it. The sponge helps block sperm from entering the uterus, and the spermicide kills sperm. To use it, you make it wet and then insert it into the vagina. It should be inserted before sex, left in for at least 6 hours after sex, and removed and thrown away within 30 hours. Spermicides Spermicides are chemicals that kill or block sperm from entering the cervix and uterus. They can come as a cream, jelly, suppository, foam, or tablet. A spermicide should be inserted into the   vagina with an applicator at least 10-15 minutes before sex to allow time for it to work. The process must be repeated every time you have sex. Spermicides do not require a prescription. Intrauterine contraception Intrauterine device (IUD) An IUD is a T-shaped device that is put in a woman's uterus. There are two types:  Hormone IUD.This type contains progestin, a synthetic form of the hormone progesterone. This type can stay in place for 3-5 years.  Copper IUD.This type is wrapped in copper wire. It can stay in place for 10 years.  Permanent methods of contraception Female tubal ligation In this method, a woman's fallopian tubes are sealed, tied, or blocked during surgery to prevent eggs from traveling to the uterus. Hysteroscopic sterilization In this method, a small, flexible insert is placed into each fallopian tube. The inserts cause scar tissue to form in the fallopian tubes and block them, so sperm cannot reach an egg. The procedure takes about 3 months to be effective. Another form of birth control must be used during those 3 months. Female sterilization This is a procedure to tie off the tubes that carry sperm  (vasectomy). After the procedure, the man can still ejaculate fluid (semen). Natural planning methods Natural family planning In this method, a couple does not have sex on days when the woman could become pregnant. Calendar method This means keeping track of the length of each menstrual cycle, identifying the days when pregnancy can happen, and not having sex on those days. Ovulation method In this method, a couple avoids sex during ovulation. Symptothermal method This method involves not having sex during ovulation. The woman typically checks for ovulation by watching changes in her temperature and in the consistency of cervical mucus. Post-ovulation method In this method, a couple waits to have sex until after ovulation. Summary  Contraception, also called birth control, means methods or devices that prevent pregnancy.  Hormonal methods of contraception include implants, injections, pills, patches, vaginal rings, and emergency contraceptives.  Barrier methods of contraception can include female condoms, female condoms, diaphragms, cervical caps, sponges, and spermicides.  There are two types of IUDs (intrauterine devices). An IUD can be put in a woman's uterus to prevent pregnancy for 3-5 years.  Permanent sterilization can be done through a procedure for males, females, or both.  Natural family planning methods involve not having sex on days when the woman could become pregnant. This information is not intended to replace advice given to you by your health care provider. Make sure you discuss any questions you have with your health care provider. Document Revised: 06/23/2017 Document Reviewed: 07/24/2016 Elsevier Patient Education  2020 Elsevier Inc.  

## 2020-01-23 NOTE — Progress Notes (Signed)
Subjective:  Cheryl Harrell is a 19 y.o. G2P1001 at [redacted]w[redacted]d being seen today for ongoing prenatal care.  She is currently monitored for the following issues for this high-risk pregnancy and has Oppositional defiant disorder; Bipolar I disorder, most recent episode depressed, severe without psychotic features (HCC); Chronic post-traumatic stress disorder (PTSD); Attention deficit hyperactivity disorder (ADHD), combined type, moderate; Supervision of high risk pregnancy, antepartum; History of pre-eclampsia in prior pregnancy, currently pregnant in second trimester; Late prenatal care affecting pregnancy in second trimester; Short interval between pregnancies affecting pregnancy in second trimester, antepartum; and History of maternal syphilis, 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  Patient reports no complaints.  Contractions: Irritability. Vag. Bleeding: None.  Movement: Present. Denies leaking of fluid.   The following portions of the patient's history were reviewed and updated as appropriate: allergies, current medications, past family history, past medical history, past social history, past surgical history and problem list. Problem list updated.  Objective:   Vitals:   01/23/20 1022  BP: 118/68  Pulse: 75  Weight: 207 lb 8 oz (94.1 kg)    Fetal Status: Fetal Heart Rate (bpm): 144   Movement: Present     General:  Alert, oriented and cooperative. Patient is in no acute distress.  Skin: Skin is warm and dry. No rash noted.   Cardiovascular: Normal heart rate noted  Respiratory: Normal respiratory effort, no problems with respiration noted  Abdomen: Soft, gravid, appropriate for gestational age. Pain/Pressure: Present     Pelvic: Vag. Bleeding: None     Cervical exam deferred        Extremities: Normal range of motion.  Edema: None  Mental Status: Normal mood and affect. Normal behavior. Normal judgment and thought content.   Urinalysis:      Assessment and Plan:   Pregnancy: G2P1001 at [redacted]w[redacted]d  1. Supervision of high risk pregnancy, antepartum - discussed contraception, pt unsure, information given - next visit with midwife to discuss waterbirth - follow-up growth scheduled today for obesity - Hepatitis C today  2. History of pre-eclampsia in prior pregnancy, currently pregnant in second trimester -BP today 118/68 -on bASA -pt has BP cuff at home and is entering pressures into [employer]  3. 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  - Last titer still 1:2 on 01/04/2020 as previous which indicates no current/new infection - TPA-ABS neg on 01/04/2020  4. Mental health disorder - Oppositional defiant disorder, Bipolar I, PTSD, ADHD - PHQ/GAD 21,21 on 01/04/2020 - discussed visit with Marijean Niemann, pt declines at this time  Preterm labor symptoms and general obstetric precautions including but not limited to vaginal bleeding, contractions, leaking of fluid and fetal movement were reviewed in detail with the patient. Please refer to After Visit Summary for other counseling recommendations.  I discussed the assessment and treatment plan with the patient. The patient was provided an opportunity to ask questions and all were answered. The patient agreed with the plan and demonstrated an understanding of the instructions. The patient was advised to call back or seek an in-person office evaluation/go to MAU at [facility] for any urgent or concerning symptoms.  Return in about 2 weeks (around 02/06/2020) for in-person/LOB/MIDWIFE ONLY/discuss waterbirth.   Almer Littleton, Odie Sera, NP

## 2020-01-24 LAB — HEPATITIS C ANTIBODY: Hep C Virus Ab: <0.1 s/co ratio (ref 0.0–0.9)

## 2020-02-12 ENCOUNTER — Ambulatory Visit (INDEPENDENT_AMBULATORY_CARE_PROVIDER_SITE_OTHER): Payer: Medicaid Other | Admitting: Student

## 2020-02-12 ENCOUNTER — Encounter: Payer: Self-pay | Admitting: Student

## 2020-02-12 ENCOUNTER — Other Ambulatory Visit: Payer: Self-pay

## 2020-02-12 VITALS — BP 109/74 | HR 95 | Wt 205.1 lb

## 2020-02-12 DIAGNOSIS — F902 Attention-deficit hyperactivity disorder, combined type: Secondary | ICD-10-CM

## 2020-02-12 DIAGNOSIS — O0990 Supervision of high risk pregnancy, unspecified, unspecified trimester: Secondary | ICD-10-CM

## 2020-02-12 DIAGNOSIS — F4312 Post-traumatic stress disorder, chronic: Secondary | ICD-10-CM

## 2020-02-12 DIAGNOSIS — F913 Oppositional defiant disorder: Secondary | ICD-10-CM

## 2020-02-12 DIAGNOSIS — Z3A32 32 weeks gestation of pregnancy: Secondary | ICD-10-CM

## 2020-02-12 DIAGNOSIS — F314 Bipolar disorder, current episode depressed, severe, without psychotic features: Secondary | ICD-10-CM

## 2020-02-12 MED ORDER — ONDANSETRON 8 MG PO TBDP
8.0000 mg | ORAL_TABLET | Freq: Three times a day (TID) | ORAL | 0 refills | Status: DC | PRN
Start: 2020-02-12 — End: 2020-07-16

## 2020-02-12 NOTE — Addendum Note (Signed)
Addended by: Henrietta Dine on: 02/12/2020 05:12 PM   Modules accepted: Orders

## 2020-02-12 NOTE — Progress Notes (Signed)
PRENATAL VISIT NOTE  Subjective:  Cheryl Harrell is a 19 y.o. G2P1001 at [redacted]w[redacted]d being seen today for ongoing prenatal care.  She is currently monitored for the following issues for this low-risk pregnancy and has Oppositional defiant disorder; Bipolar I disorder, most recent episode depressed, severe without psychotic features (HCC); Chronic post-traumatic stress disorder (PTSD); Attention deficit hyperactivity disorder (ADHD), combined type, moderate; Supervision of high risk pregnancy, antepartum; History of pre-eclampsia in prior pregnancy, currently pregnant in second trimester; Late prenatal care affecting pregnancy in second trimester; Short interval between pregnancies affecting pregnancy in second trimester, antepartum; and History of maternal syphilis, 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  Patient reports no complaints.  She is taking baby ASA; she is checking her BP weekly. She knows the warning signs of pre-e and elevated BP. SHe wil take WB class on 09/1; she missed the class on 02/06/2020. She wants to know why we are not checking her urine or frequently measuring her belly. She says that her nausea is coming back; she was not able to pick up her medicine because it was too expensive.   Contractions: Irritability. Vag. Bleeding: None.  Movement: Present. Denies leaking of fluid.   The following portions of the patient's history were reviewed and updated as appropriate: allergies, current medications, past family history, past medical history, past social history, past surgical history and problem list.   Objective:   Vitals:   02/12/20 1608  BP: 109/74  Pulse: 95  Weight: 205 lb 1.6 oz (93 kg)    Fetal Status: Fetal Heart Rate (bpm): 154 Fundal Height: 34 cm Movement: Present     General:  Alert, oriented and cooperative. Patient is in no acute distress.  Skin: Skin is warm and dry. No rash noted.   Cardiovascular: Normal heart rate noted  Respiratory:  Normal respiratory effort, no problems with respiration noted  Abdomen: Soft, gravid, appropriate for gestational age.  Pain/Pressure: Present     Pelvic: Cervical exam deferred        Extremities: Normal range of motion.  Edema: None  Mental Status: Normal mood and affect. Normal behavior. Normal judgment and thought content.   Assessment and Plan:  Pregnancy: G2P1001 at [redacted]w[redacted]d  1. [redacted] weeks gestation of pregnancy   2. Supervision of high risk pregnancy, antepartum    -explained measurements of fundal height and reasons why we do not routinely do POC urine testing at prenatal visits.  -continue taking aspirin -continue monitoring BP; patient states that she is "very" on top of her blood pressure -signed WB consent today; reviewed that WB is only available with low risk pregnancy and labor and if she has to be induced, BP is high again, or other concerns for baby, we won't be able to do a waterbirth. Patient understands limitations to waterbirth.  -wants RX for nausea medicine; given zofran RX for GoodRX coupon.   Preterm labor symptoms and general obstetric precautions including but not limited to vaginal bleeding, contractions, leaking of fluid and fetal movement were reviewed in detail with the patient. Please refer to After Visit Summary for other counseling recommendations.   Return in about 2 weeks (around 02/26/2020), or in person LROB.  Future Appointments  Date Time Provider Department Center  02/27/2020 10:35 AM Alric Seton, MD [facility]  03/11/2020  1:15 PM Armando Reichert, CNM [facility]  03/18/2020  1:15 PM Mcneil Sober [facility]  03/25/2020  1:15 PM Alric Seton, MD [facility]  [facility]  04/01/2020  1:15 PM Marylene Land, CNM [facility]    Marylene Land, CNM

## 2020-02-27 ENCOUNTER — Other Ambulatory Visit: Payer: Self-pay

## 2020-02-27 ENCOUNTER — Ambulatory Visit (INDEPENDENT_AMBULATORY_CARE_PROVIDER_SITE_OTHER): Payer: Medicaid Other | Admitting: Family Medicine

## 2020-02-27 VITALS — BP 101/67 | HR 90 | Wt 210.0 lb

## 2020-02-27 DIAGNOSIS — F902 Attention-deficit hyperactivity disorder, combined type: Secondary | ICD-10-CM

## 2020-02-27 DIAGNOSIS — O0990 Supervision of high risk pregnancy, unspecified, unspecified trimester: Secondary | ICD-10-CM

## 2020-02-27 DIAGNOSIS — F4312 Post-traumatic stress disorder, chronic: Secondary | ICD-10-CM

## 2020-02-27 DIAGNOSIS — F913 Oppositional defiant disorder: Secondary | ICD-10-CM

## 2020-02-27 DIAGNOSIS — O0932 Supervision of pregnancy with insufficient antenatal care, second trimester: Secondary | ICD-10-CM

## 2020-02-27 DIAGNOSIS — O09892 Supervision of other high risk pregnancies, second trimester: Secondary | ICD-10-CM

## 2020-02-27 DIAGNOSIS — O09292 Supervision of pregnancy with other poor reproductive or obstetric history, second trimester: Secondary | ICD-10-CM

## 2020-02-27 DIAGNOSIS — F314 Bipolar disorder, current episode depressed, severe, without psychotic features: Secondary | ICD-10-CM

## 2020-02-27 NOTE — Progress Notes (Signed)
PRENATAL VISIT NOTE  Subjective:  Cheryl Harrell is a 19 y.o. G2P1001 at [redacted]w[redacted]d being seen today for ongoing prenatal care.  She is currently monitored for the following issues for this low-risk pregnancy and has Oppositional defiant disorder; Bipolar I disorder, most recent episode depressed, severe without psychotic features (HCC); Chronic post-traumatic stress disorder (PTSD); Attention deficit hyperactivity disorder (ADHD), combined type, moderate; Supervision of high risk pregnancy, antepartum; History of pre-eclampsia in prior pregnancy, currently pregnant in second trimester; Late prenatal care affecting pregnancy in second trimester; Short interval between pregnancies affecting pregnancy in second trimester, antepartum; and History of maternal syphilis, 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  Patient reports no complaints.  Contractions: Irritability. Vag. Bleeding: None.  Movement: Present. Denies leaking of fluid.   The following portions of the patient's history were reviewed and updated as appropriate: allergies, current medications, past family history, past medical history, past social history, past surgical history and problem list.   Objective:   Vitals:   02/27/20 1035  BP: 101/67  Pulse: 90  Weight: 210 lb (95.3 kg)    Fetal Status: Fetal Heart Rate (bpm): 130   Movement: Present     General:  Alert, oriented and cooperative. Patient is in no acute distress.  Skin: Skin is warm and dry. No rash noted.   Cardiovascular: Normal heart rate noted  Respiratory: Normal respiratory effort, no problems with respiration noted  Abdomen: Soft, gravid, appropriate for gestational age.  Pain/Pressure: Present     Pelvic: Cervical exam deferred        Extremities: Normal range of motion.  Edema: None  Mental Status: Normal mood and affect. Normal behavior. Normal judgment and thought content.   Assessment and Plan:  Pregnancy: G2P1001 at  [redacted]w[redacted]d  Supervision of high risk pregnancy, antepartum -Doing well without complaints -Taking PNV and bASA as prescribed -Taking waterbirth class 03/05/20 -GBS/GCC at next appt  Oppositional defiant disorder Bipolar I disorder, most recent episode depressed, severe without psychotic features (HCC) Chronic post-traumatic stress disorder (PTSD) Attention deficit hyperactivity disorder (ADHD), combined type, moderate Has had elevated GAD/PHQ throughout pregnancy. States she was previously on meds prior to pregnancy, can't remember what. She declines med initiation or referral to psychiatry at this time. She does have an upcoming meeting with BH, Asher Muir that she scheduled. Has declined to meet with [facility] in the past. No current thoughts of SI/HI.  History of pre-eclampsia in prior pregnancy, currently pregnant in third trimester -Compliant with bASA daily -Home BP monitoring without elevated readings -PreE labs have been unremarkable -Asymptomatic  Late prenatal care affecting pregnancy in second trimester NOB at [redacted]w[redacted]d  Short interval between pregnancies affecting pregnancy in second trimester, antepartum Last child born 12/02/2018.  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 Patient reports false positive. Her RPR will likely always be positive when checked, and will always need reflex testing.   Preterm labor symptoms and general obstetric precautions including but not limited to vaginal bleeding, contractions, leaking of fluid and fetal movement were reviewed in detail with the patient. Please refer to After Visit Summary for other counseling recommendations.   No follow-ups on file.  Future Appointments  Date Time Provider Department Center  03/06/2020  2:15 PM [facility]INIC[facility]  03/11/2020  1:15 PM Armando Reichert, CNM [facility]  03/18/2020  1:15 PM Sharyon Cable, CNM [facility]  03/25/2020  1:15 PM Alric Seton, MD [facility]  04/01/2020  1:15 PM Marvetta Gibbons, Brand Males, NP [facility]    Alric Seton, MD

## 2020-02-28 NOTE — BH Specialist Note (Addendum)
error 

## 2020-02-29 ENCOUNTER — Telehealth: Payer: Self-pay

## 2020-02-29 ENCOUNTER — Other Ambulatory Visit: Payer: Self-pay | Admitting: Critical Care Medicine

## 2020-02-29 ENCOUNTER — Other Ambulatory Visit: Payer: Medicaid Other

## 2020-02-29 DIAGNOSIS — Z20822 Contact with and (suspected) exposure to covid-19: Secondary | ICD-10-CM

## 2020-02-29 NOTE — Telephone Encounter (Signed)
Returned Pts call regarding questions about medications, she asked could she take Alka Seltzer Plus for colds. Advised Pt to take Tylenol Cold Products or Robitussin Cold. Pt verbalized understanding.

## 2020-02-29 NOTE — Telephone Encounter (Signed)
Pt left message on Nurse visit regarding Medications. Rcvd today @ 11:32a.

## 2020-03-01 LAB — SARS-COV-2, NAA 2 DAY TAT

## 2020-03-01 LAB — NOVEL CORONAVIRUS, NAA: SARS-CoV-2, NAA: NOT DETECTED

## 2020-03-06 ENCOUNTER — Ambulatory Visit: Payer: Medicaid Other | Admitting: Clinical

## 2020-03-06 ENCOUNTER — Other Ambulatory Visit: Payer: Self-pay

## 2020-03-11 ENCOUNTER — Other Ambulatory Visit (HOSPITAL_COMMUNITY)
Admission: RE | Admit: 2020-03-11 | Discharge: 2020-03-11 | Disposition: A | Discharge status: Home / Self Care | Payer: Medicaid Other | Source: Ambulatory Visit | Attending: Advanced Practice Midwife | Admitting: Advanced Practice Midwife

## 2020-03-11 ENCOUNTER — Encounter: Payer: Self-pay | Admitting: Advanced Practice Midwife

## 2020-03-11 ENCOUNTER — Encounter: Payer: Self-pay | Admitting: General Practice

## 2020-03-11 ENCOUNTER — Ambulatory Visit (INDEPENDENT_AMBULATORY_CARE_PROVIDER_SITE_OTHER): Payer: Medicaid Other | Admitting: Advanced Practice Midwife

## 2020-03-11 ENCOUNTER — Other Ambulatory Visit: Payer: Self-pay

## 2020-03-11 VITALS — BP 104/71 | HR 114 | Wt 215.0 lb

## 2020-03-11 DIAGNOSIS — Z3A36 36 weeks gestation of pregnancy: Secondary | ICD-10-CM

## 2020-03-11 DIAGNOSIS — O0990 Supervision of high risk pregnancy, unspecified, unspecified trimester: Secondary | ICD-10-CM

## 2020-03-11 NOTE — Progress Notes (Signed)
   PRENATAL VISIT NOTE  Subjective:  Cheryl Harrell is a 19 y.o. G2P1001 at [redacted]w[redacted]d being seen today for ongoing prenatal care.  She is currently monitored for the following issues for this low-risk pregnancy and has Oppositional defiant disorder; Bipolar I disorder, most recent episode depressed, severe without psychotic features (HCC); Chronic post-traumatic stress disorder (PTSD); Attention deficit hyperactivity disorder (ADHD), combined type, moderate; Supervision of high risk pregnancy, antepartum; History of pre-eclampsia in prior pregnancy, currently pregnant in second trimester; Late prenatal care affecting pregnancy in second trimester; Short interval between pregnancies affecting pregnancy in second trimester, antepartum; and History of maternal syphilis, 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  Patient reports no complaints.  Contractions: Irritability. Vag. Bleeding: None.  Movement: Present. Denies leaking of fluid.   The following portions of the patient's history were reviewed and updated as appropriate: allergies, current medications, past family history, past medical history, past social history, past surgical history and problem list.   Objective:   Vitals:   03/11/20 1317  BP: 104/71  Pulse: (!) 114  Weight: 215 lb (97.5 kg)    Fetal Status: Fetal Heart Rate (bpm): 145   Movement: Present     General:  Alert, oriented and cooperative. Patient is in no acute distress.  Skin: Skin is warm and dry. No rash noted.   Cardiovascular: Normal heart rate noted  Respiratory: Normal respiratory effort, no problems with respiration noted  Abdomen: Soft, gravid, appropriate for gestational age.  Pain/Pressure: Present     Pelvic: Cervical exam performed in the presence of a chaperone        Extremities: Normal range of motion.  Edema: None  Mental Status: Normal mood and affect. Normal behavior. Normal judgment and thought content.   Assessment and Plan:    Pregnancy: G2P1001 at [redacted]w[redacted]d 1. Supervision of high risk pregnancy, antepartum - Culture, beta strep (group b only) - GC/Chlamydia probe amp ([place])not at [facility]  2. [redacted] weeks gestation of pregnancy - [facility] Covid vaccine handout given  - Recommended covid vaccine   Term labor symptoms and general obstetric precautions including but not limited to vaginal bleeding, contractions, leaking of fluid and fetal movement were reviewed in detail with the patient. Please refer to After Visit Summary for other counseling recommendations.   Return in about 1 week (around 03/18/2020) for VIRTUAL VISIT .  Future Appointments  Date Time Provider Department Center  03/18/2020  1:15 PM Mcneil Sober [facility]  03/25/2020  1:15 PM Alric Seton, MD [facility]  04/01/2020  1:15 PM Currie Paris, NP [facility]    Thressa Sheller DNP, CNM  03/11/20  1:27 PM

## 2020-03-11 NOTE — Patient Instructions (Signed)
COVID-19 Vaccine Information can be found at: [email or web address] For questions related to vaccine distribution or appointments, please email [email or web address] or call [phone].   COVID-19 Vaccination if You Are Pregnant or Breastfeeding  The Society for Maternal-Fetal Medicine [facility]) and other pregnancy experts recommend that pregnant and lactating people be vaccinated against COVID-19. The Centers for Disease Control and Prevention (CDC) also recommend vaccination for all people aged 21 years and older, including people who are pregnant, breastfeeding, trying to get pregnant now, or might become pregnant in the future. Vaccination is the best way to reduce the risks of COVID-19 infection and COVID-related complications for both you and your baby.  Three vaccines are available to prevent COVID-19:  The two-dose Pfizer vaccine for people 12 years and older--APPROVED by the [place] Food and Drug Administration on February 25, 2020  The two-dose Moderna vaccine for people 18 years and older--AUTHORIZED for emergency use  The one-dose [employer]user[employer] vaccine for people 18 years and older (you may also see this vaccine referred to as the [employer] Dibbles vaccine)--AUTHORIZED for emergency use  For those receiving [employer] and [employer], the second dose is given 21 days [employer]) and 28 days (Moderna) after the first dose. The [employer]user[employer] vaccine is only one dose.  Information for Pregnant Individuals If you are pregnant or planning to become pregnant and are thinking about getting vaccinated, consider talking with your health care professional about the vaccine.   To help with your decision, you should consider the following key points: Anyone can get the COVID vaccines free of charge regardless of immigration status or whether they have insurance. You may be asked for your social security number, but it is  NOT required to get  vaccinated.  What are benefits of getting the COVID-19 vaccines during pregnancy?   The vaccines can help protect you from getting COVID-19. With the two-dose vaccines, you must get both doses for maximum effectiveness. Its not yet known how long protection lasts.   Another potential benefit is that getting the vaccine while pregnant may help you pass antiCOVID-19 antibodies to your baby. In numerous studies of vaccinated moms, antibodies were found in the umbilical cord blood of babies and in the mothers breastmilk.   The CDC, along with other federal partners, are monitoring people who have been vaccinated for serious side effects. So far, more than 139,000 pregnant people have been vaccinated. No unexpected pregnancy or fetal problems have occurred. There have been no reports of any increased risk of pregnancy loss, growth problems, or birth defects.   A safe vaccine is generally considered one in which the benefits of being vaccinated outweigh the risks. The current vaccines are not live vaccines. There is only a very small  chance that they cross the placenta, so its unlikely that they even reach the fetus. Vaccines dont affect future fertility. The only people who should NOT get vaccinated are those who have had a severe allergic reaction to vaccines in the past or any vaccine ingredients.   Side effects may occur in the first 3 days after getting vaccinated.1 These include mild to moderate fever, headache, and muscle aches. Side effects may be worse after the second dose of the [employer] and Moderna vaccines. Fever should be avoided during pregnancy,especially in the first trimester. Those who develop a fever after vaccination can take acetaminophen (Tylenol). This medication is safe to use during pregnancy and does not  affect how the vaccine works.   What are the known risks  of getting COVID-19 during pregnancy?  About 1 to 3 per 1,000 pregnant women with COVID-19 will develop severe  disease. Compared with those who arent pregnant, pregnant people infected by the COVID-19 virus:  Are 3 times more likely to need ICU care  Are 2 to 3 times more likely to need advanced life support and a breathing tube   Have a small increased risk of dying due to COVID-19 They may also be at increased risk of stillbirth and preterm birth.  What is my risk of getting COVID-19?  Your risk of getting COVID-19 depends on the chance that you will come into contact with another infected person. The risk may be higher if you live in a community where there is a lot of COVID-19 infection or work in healthcare or another high-contact setting.   What is my risk for severe complications if I get COVID-19?  Data show that older pregnant women; those with preexisting health conditions, such as a body mass index higher than 35 kg/m2, diabetes, and heart disorders; and Black or Latinx women have an especially increased risk of severe disease and death from COVID-19.   If you still have questions about the vaccines or need more information, ask your health care provider or go to the Centers for Disease Control and Preventions COVID-19 vaccine webpage.   An Update on the [employer] Vaccine   In April 2021, the FDA and CDC called for a brief pause to use of the [employer]user[employer] vaccine. They did so after reports of a severe side effect in a very small number of women younger than age 21 following vaccination. This side effect, called thrombosis with thrombocytopenia syndrome (TTS), causes blood clots (thrombosis) combined with low levels of platelets (thrombocytopenia).  TTS following the [employer] vaccine is extremely rare. At the time of this update, it has occurred in only 7 people per 1 million [employer] shots given. According to the CDC, being on hormonal birth control (the pill, patch, or ring), pregnancy, breastfeeding, or being recently pregnant does not make you more likely to  develop TTS after getting the [employer] vaccine. The pause was lifted on October 26, 2019, after the FDA and CDC determined that the known benefits of the [employer] vaccine far outweigh the risks.   Health care professionals have been alerted to the possibility of this side effect in people who have received the [facility] vaccine. National organizations continue to recommend COVID-19 vaccination with any of the vaccines for pregnant women. All women younger than age 3 years, whether pregnant, breastfeeding, or not, should be aware of the very rare risk of TTS after getting the [employer] vaccine. The Pfizer and [employer] dont have this risk. If you get the [employer]user[employer]ch vaccine,  seek medical help right away if you develop any of the following symptoms within 3 weeks of getting your shot:   Severe or persistent headaches or blurred vision  Shortness of breath  Chest pain  Leg swelling  Persistent abdominal pain  Easy bruising or tiny blood spots under the skin beyond the injection site  Experts continue to collect health and safety information from pregnant people who have been vaccinated. If you have questions about vaccination during pregnancy, visit the CDC website or talk to your health care professional. Information for Breastfeeding/Lactating Individuals The Society for Maternal-Fetal Medicine and other pregnancy experts recommend COVID-19 vaccination for people who are breastfeeding/lactating.  You dont have to delay or stop  breastfeeding just because you get vaccinated.   Getting Vaccinated  You can get vaccinated at any time during pregnancy. The CDC is committed to monitoring the vaccines safety for all individuals. Your health professional or vaccine clinic may give you information about enrolling in the v-safe after vaccination health checker (see the box below).Even after youre fully vaccinated, it is important to follow the CDCs  guidance for wearing a mask indoors in areas where there are substantial or high rates of COVID-19 infection.   What Happens When You Enroll in v-Safe?  The v-safe after vaccination health checker program lets the CDC check in with you after your vaccination. At sign-up, you can indicate that you are pregnant. Once you do that, expect the following:  Someone may call you from the v-safe program to ask initial questions and get more information.  You may be asked to enroll in the vaccine pregnancy registry, which is collecting information about any effects of the vaccine during pregnancy. This is a great way to help scientists monitor the vaccines safety and effectiveness.   References 1. [address], Voncille Lo M, Wallace M, Curran KG, Chamberland M, et al. The Advisory Committee on [employer] Interim Recommendation for Use of Pfizer-BioNTech  COVID-19 Vaccine -- [place], December 2020. MMWR Morbidity and Mortality Weekly Report 2020;69. 2. FDA Briefing Document. Janssen Ad26.COV2.S Vaccine for the Prevention of COVID-19. 2021. Accessed Sep 07, 2019; Available from: FemaleHumor.es 3. PFIZER-BIONTECH COVID-19 VACCINE [package insert] [place]: Pfizer and Violet, [place]: Biontech;2020. 4. FDA Briefing Document. Moderna COVID-19 Vaccine. 2020. Accessed 2020, Dec 18; Available from: DateTunes.nl  5. Bea Laura, Bordt EA, Atyeo C, Deriso E, Akinwunmi B, Young N, et al. COVID-19 vaccine response in pregnant and lactating women: a cohort study. Am J Obstet Gynecol 2021 Mar 24. 6. Panagiotakopoulos Elbert Ewings, Omer Jack, Lipkind HS, Caprice Renshaw DS, et al. SARS-CoV-2 Infection Among Hospitalized Pregnant Women: Reasons for Admission and Pregnancy  Characteristics - [facility], March 1-Dec 02, 2018. MMWR Morb Baxter Flattery Rep 2020 Sep 23;69(38):1355-9. 7. Zambrano LD, Birdsong, Strid P, [place], Baldo Ash VT, et al. Update: Characteristics of Symptomatic Women of Reproductive Age with Laboratory-Confirmed SARSCoV-2 Infection by Pregnancy Status - [place] States, January 22-April 07, 2019. MMWR Morb Baxter Flattery Rep 2020 Nov 6;69(44):1641-7. 8. Delahoy MJ, Georgia Duff, Weston Settle PD, Blima Ledger, et al. Characteristics and Maternal and Birth Outcomes of Hospitalized Pregnant Women with Laboratory-Confirmed COVID-19 - COVID-NET, 71 States, March 1-February 24, 2019. MMWR Morb Baxter Flattery Rep 2020 Sep 25;69(38):1347-54.

## 2020-03-11 NOTE — Progress Notes (Signed)
High PHQ9 refuses to see anyone

## 2020-03-12 LAB — GC/CHLAMYDIA PROBE AMP (~~LOC~~) NOT AT ARMC
Chlamydia: NEGATIVE
Comment: NEGATIVE
Comment: NORMAL
Neisseria Gonorrhea: NEGATIVE

## 2020-03-15 LAB — CULTURE, BETA STREP (GROUP B ONLY): Strep Gp B Culture: NEGATIVE

## 2020-03-18 ENCOUNTER — Ambulatory Visit (INDEPENDENT_AMBULATORY_CARE_PROVIDER_SITE_OTHER): Payer: Medicaid Other | Admitting: Certified Nurse Midwife

## 2020-03-18 ENCOUNTER — Other Ambulatory Visit: Payer: Self-pay

## 2020-03-18 ENCOUNTER — Encounter: Payer: Self-pay | Admitting: Certified Nurse Midwife

## 2020-03-18 VITALS — BP 108/75 | HR 80 | Wt 214.7 lb

## 2020-03-18 DIAGNOSIS — O09892 Supervision of other high risk pregnancies, second trimester: Secondary | ICD-10-CM

## 2020-03-18 DIAGNOSIS — O0990 Supervision of high risk pregnancy, unspecified, unspecified trimester: Secondary | ICD-10-CM

## 2020-03-18 DIAGNOSIS — O09292 Supervision of pregnancy with other poor reproductive or obstetric history, second trimester: Secondary | ICD-10-CM

## 2020-03-18 DIAGNOSIS — Z3A37 37 weeks gestation of pregnancy: Secondary | ICD-10-CM

## 2020-03-18 NOTE — Progress Notes (Signed)
   PRENATAL VISIT NOTE  Subjective:  Cheryl Harrell is a 18 y.o. G2P1001 at [redacted]w[redacted]d being seen today for ongoing prenatal care.  She is currently monitored for the following issues for this high-risk pregnancy and has Oppositional defiant disorder; Bipolar I disorder, most recent episode depressed, severe without psychotic features (HCC); Chronic post-traumatic stress disorder (PTSD); Attention deficit hyperactivity disorder (ADHD), combined type, moderate; Supervision of high risk pregnancy, antepartum; History of pre-eclampsia in prior pregnancy, currently pregnant in second trimester; Late prenatal care affecting pregnancy in second trimester; Short interval between pregnancies affecting pregnancy in second trimester, antepartum; and History of maternal syphilis, 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  Patient reports no complaints.  Contractions: Irritability. Vag. Bleeding: None.  Movement: Present. Denies leaking of fluid.   The following portions of the patient's history were reviewed and updated as appropriate: allergies, current medications, past family history, past medical history, past social history, past surgical history and problem list.   Objective:   Vitals:   03/18/20 1320  BP: 108/75  Pulse: 80  Weight: 214 lb 11.2 oz (97.4 kg)    Fetal Status: Fetal Heart Rate (bpm): 141 Fundal Height: 36 cm Movement: Present  Presentation: Vertex  General:  Alert, oriented and cooperative. Patient is in no acute distress.  Skin: Skin is warm and dry. No rash noted.   Cardiovascular: Normal heart rate noted  Respiratory: Normal respiratory effort, no problems with respiration noted  Abdomen: Soft, gravid, appropriate for gestational age.  Pain/Pressure: Present     Pelvic: Cervical exam performed in the presence of a chaperone Dilation: Fingertip Effacement (%): Thick Station: Ballotable  Extremities: Normal range of motion.  Edema: Trace  Mental Status: Normal  mood and affect. Normal behavior. Normal judgment and thought content.   Assessment and Plan:  Pregnancy: G2P1001 at [redacted]w[redacted]d 1. Supervision of high risk pregnancy, antepartum - high risk based on hx of PEC otherwise low risk and plans waterbirth  - Patient doing well, no complaints  - Routine prenatal care - Anticipatory guidance on upcoming appointments - patient request cervical examination today   2. [redacted] weeks gestation of pregnancy  3. History of pre-eclampsia in prior pregnancy, currently pregnant in second trimester -BP stable, no elevated BP throughout pregnancy so far   4. Short interval between pregnancies affecting pregnancy in second trimester, antepartum - last child born 11/2018  Term labor symptoms and general obstetric precautions including but not limited to vaginal bleeding, contractions, leaking of fluid and fetal movement were reviewed in detail with the patient. Please refer to After Visit Summary for other counseling recommendations.   Return in about 1 week (around 03/25/2020) for ROB-mychart .  Future Appointments  Date Time Provider Department Center  03/25/2020  1:15 PM Alric Seton, MD [facility]  04/01/2020  1:15 PM Currie Paris, NP [facility]    Sharyon Cable, CNM

## 2020-03-18 NOTE — Patient Instructions (Addendum)
Cervical Ripening (to get your cervix ready for labor) : May try one or all:  Red Raspberry Leaf capsules:  two 300mg  or 400mg  tablets with each meal, 2-3 times a day  Potential Side Effects Of Raspberry Leaf:  Most women do not experience any side effects from drinking raspberry leaf tea. However, nausea and loose stools are possible   Evening Primrose Oil capsules: may take 1 to 3 capsules daily. May also prick one to release the oil and insert it into your vagina at night.  Some of the potential side effects:  Upset stomach  Loose stools or diarrhea  Headaches  Nausea   4 Dates a day (may taste better if warmed in microwave until soft). Found where raisins are in the grocery store   Third Trimester of Pregnancy  The third trimester is from week 28 through week 40 (months 7 through 9). This trimester is when your unborn baby (fetus) is growing very fast. At the end of the ninth month, the unborn baby is about 20 inches in length. It weighs about 6-10 pounds. Follow these instructions at home: Medicines  Take over-the-counter and prescription medicines only as told by your doctor. Some medicines are safe and some medicines are not safe during pregnancy.  Take a prenatal vitamin that contains at least 600 micrograms (mcg) of folic acid.  If you have trouble pooping (constipation), take medicine that will make your stool soft (stool softener) if your doctor approves. Eating and drinking   Eat regular, healthy meals.  Avoid raw meat and uncooked cheese.  If you get low calcium from the food you eat, talk to your doctor about taking a daily calcium supplement.  Eat four or five small meals rather than three large meals a day.  Avoid foods that are high in fat and sugars, such as fried and sweet foods.  To prevent constipation: ? Eat foods that are high in fiber, like fresh fruits and vegetables, whole grains, and beans. ? Drink enough fluids to keep your pee (urine) clear or  pale yellow. Activity  Exercise only as told by your doctor. Stop exercising if you start to have cramps.  Avoid heavy lifting, wear low heels, and sit up straight.  Do not exercise if it is too hot, too humid, or if you are in a place of great height (high altitude).  You may continue to have sex unless your doctor tells you not to. Relieving pain and discomfort  Wear a good support bra if your breasts are tender.  Take frequent breaks and rest with your legs raised if you have leg cramps or low back pain.  Take warm water baths (sitz baths) to soothe pain or discomfort caused by hemorrhoids. Use hemorrhoid cream if your doctor approves.  If you develop puffy, bulging veins (varicose veins) in your legs: ? Wear support hose or compression stockings as told by your doctor. ? Raise (elevate) your feet for 15 minutes, 3-4 times a day. ? Limit salt in your food. Safety  Wear your seat belt when driving.  Make a list of emergency phone numbers, including numbers for family, friends, the hospital, and police and fire departments. Preparing for your baby's arrival To prepare for the arrival of your baby:  Take prenatal classes.  Practice driving to the hospital.  Visit the hospital and tour the maternity area.  Talk to your work about taking leave once the baby comes.  Pack your hospital bag.  Prepare the baby's room.  Go to your doctor visits.  Buy a rear-facing car seat. Learn how to install it in your car. General instructions  Do not use hot tubs, steam rooms, or saunas.  Do not use any products that contain nicotine or tobacco, such as cigarettes and e-cigarettes. If you need help quitting, ask your doctor.  Do not drink alcohol.  Do not douche or use tampons or scented sanitary pads.  Do not cross your legs for long periods of time.  Do not travel for long distances unless you must. Only do so if your doctor says it is okay.  Visit your dentist if you have  not gone during your pregnancy. Use a soft toothbrush to brush your teeth. Be gentle when you floss.  Avoid cat litter boxes and soil used by cats. These carry germs that can cause birth defects in the baby and can cause a loss of your baby (miscarriage) or stillbirth.  Keep all your prenatal visits as told by your doctor. This is important. Contact a doctor if:  You are not sure if you are in labor or if your water has broken.  You are dizzy.  You have mild cramps or pressure in your lower belly.  You have a nagging pain in your belly area.  You continue to feel sick to your stomach, you throw up, or you have watery poop.  You have bad smelling fluid coming from your vagina.  You have pain when you pee. Get help right away if:  You have a fever.  You are leaking fluid from your vagina.  You are spotting or bleeding from your vagina.  You have severe belly cramps or pain.  You lose or gain weight quickly.  You have trouble catching your breath and have chest pain.  You notice sudden or extreme puffiness (swelling) of your face, hands, ankles, feet, or legs.  You have not felt the baby move in over an hour.  You have severe headaches that do not go away with medicine.  You have trouble seeing.  You are leaking, or you are having a gush of fluid, from your vagina before you are 37 weeks.  You have regular belly spasms (contractions) before you are 37 weeks. Summary  The third trimester is from week 28 through week 40 (months 7 through 9). This time is when your unborn baby is growing very fast.  Follow your doctor's advice about medicine, food, and activity.  Get ready for the arrival of your baby by taking prenatal classes, getting all the baby items ready, preparing the baby's room, and visiting your doctor to be checked.  Get help right away if you are bleeding from your vagina, or you have chest pain and trouble catching your breath, or if you have not felt your  baby move in over an hour. This information is not intended to replace advice given to you by your health care provider. Make sure you discuss any questions you have with your health care provider. Document Revised: 10/12/2018 Document Reviewed: 07/27/2016 Elsevier Patient Education  2020 [employer].

## 2020-03-18 NOTE — Progress Notes (Signed)
Patient scores high on PHQ9/GAD7 but declines a BH consult.

## 2020-03-25 ENCOUNTER — Ambulatory Visit (INDEPENDENT_AMBULATORY_CARE_PROVIDER_SITE_OTHER): Payer: Medicaid Other | Admitting: Family Medicine

## 2020-03-25 ENCOUNTER — Other Ambulatory Visit: Payer: Self-pay

## 2020-03-25 VITALS — BP 114/67 | HR 83 | Wt 213.3 lb

## 2020-03-25 DIAGNOSIS — A530 Latent syphilis, unspecified as early or late: Secondary | ICD-10-CM | POA: Insufficient documentation

## 2020-03-25 DIAGNOSIS — O09292 Supervision of pregnancy with other poor reproductive or obstetric history, second trimester: Secondary | ICD-10-CM

## 2020-03-25 DIAGNOSIS — F4312 Post-traumatic stress disorder, chronic: Secondary | ICD-10-CM

## 2020-03-25 DIAGNOSIS — F902 Attention-deficit hyperactivity disorder, combined type: Secondary | ICD-10-CM

## 2020-03-25 DIAGNOSIS — O09892 Supervision of other high risk pregnancies, second trimester: Secondary | ICD-10-CM

## 2020-03-25 DIAGNOSIS — F314 Bipolar disorder, current episode depressed, severe, without psychotic features: Secondary | ICD-10-CM

## 2020-03-25 DIAGNOSIS — F913 Oppositional defiant disorder: Secondary | ICD-10-CM

## 2020-03-25 DIAGNOSIS — O0990 Supervision of high risk pregnancy, unspecified, unspecified trimester: Secondary | ICD-10-CM

## 2020-03-25 NOTE — Progress Notes (Signed)
° °  PRENATAL VISIT NOTE  Subjective:  Cheryl Harrell is a 19 y.o. G2P1001 at [redacted]w[redacted]d being seen today for ongoing prenatal care.  She is currently monitored for the following issues for this low-risk pregnancy and has Oppositional defiant disorder; Bipolar I disorder, most recent episode depressed, severe without psychotic features (HCC); Chronic post-traumatic stress disorder (PTSD); Attention deficit hyperactivity disorder (ADHD), combined type, moderate; Supervision of high risk pregnancy, antepartum; History of pre-eclampsia in prior pregnancy, currently pregnant in second trimester; Late prenatal care affecting pregnancy in second trimester; Short interval between pregnancies affecting pregnancy in second trimester, antepartum; History of maternal syphilis, 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  Patient reports no complaints.  Contractions: Irritability. Vag. Bleeding: None.  Movement: Present. Denies leaking of fluid.   The following portions of the patient's history were reviewed and updated as appropriate: allergies, current medications, past family history, past medical history, past social history, past surgical history and problem list.   Objective:   Vitals:   03/25/20 1331  BP: 114/67  Pulse: 83  Weight: 213 lb 4.8 oz (96.8 kg)    Fetal Status: Fetal Heart Rate (bpm): 147   Movement: Present     General:  Alert, oriented and cooperative. Patient is in no acute distress.  Skin: Skin is warm and dry. No rash noted.   Cardiovascular: Normal heart rate noted  Respiratory: Normal respiratory effort, no problems with respiration noted  Abdomen: Soft, gravid, appropriate for gestational age.  Pain/Pressure: Present     Pelvic: Cervical exam deferred        Extremities: Normal range of motion.  Edema: Trace  Mental Status: Normal mood and affect. Normal behavior. Normal judgment and thought content.   Assessment and Plan:  Pregnancy:  G2P1001 at [redacted]w[redacted]d  Supervision of high risk pregnancy, antepartum -Doing well without complaints -Taking PNV and bASA as prescribed -Planning for water birth, previously had CNM visit, signed consents, and took class  Oppositional defiant disorder Bipolar I disorder, most recent episode depressed, severe without psychotic features (HCC) Chronic post-traumatic stress disorder (PTSD) Attention deficit hyperactivity disorder (ADHD), combined type, moderate Has had elevated GAD/PHQ throughout pregnancy. States she was previously on meds prior to pregnancy, can't remember what. She declines med initiation or referral to psychiatry at this time. She had a prior visit scheduled with BH but no showed, she is encouraged to call and set up another visit.  History of pre-eclampsia in prior pregnancy, currently pregnant in third trimester -Compliant with bASA daily -Home BP monitoring without elevated readings -PreE labs have been unremarkable -Asymptomatic  Late prenatal care affecting pregnancy in second trimester NOB at [redacted]w[redacted]d  Short interval between pregnancies affecting pregnancy in second trimester, antepartum Last child born 12/02/2018.  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 Patient reports false positive. Her RPR will likely always be positive when checked, and will always need reflex testing.   Term labor symptoms and general obstetric precautions including but not limited to vaginal bleeding, contractions, leaking of fluid and fetal movement were reviewed in detail with the patient. Please refer to After Visit Summary for other counseling recommendations.   Return in about 1 week (around 04/01/2020) for LOB; in person, already scheduled.  Future Appointments  Date Time Provider Department Center  04/01/2020  1:15 PM Currie Paris, NP [facility]    Alric Seton, MD

## 2020-03-30 ENCOUNTER — Other Ambulatory Visit: Payer: Self-pay

## 2020-03-30 ENCOUNTER — Encounter (HOSPITAL_COMMUNITY): Payer: Self-pay | Admitting: Family Medicine

## 2020-03-30 ENCOUNTER — Inpatient Hospital Stay (HOSPITAL_COMMUNITY)
Admission: AD | Admit: 2020-03-30 | Discharge: 2020-03-30 | Disposition: A | Discharge status: Home / Self Care | Payer: Medicaid Other | Attending: Family Medicine | Admitting: Family Medicine

## 2020-03-30 DIAGNOSIS — Z3A38 38 weeks gestation of pregnancy: Secondary | ICD-10-CM | POA: Diagnosis not present

## 2020-03-30 DIAGNOSIS — O471 False labor at or after 37 completed weeks of gestation: Secondary | ICD-10-CM

## 2020-03-30 LAB — URINALYSIS, ROUTINE W REFLEX MICROSCOPIC
Bilirubin Urine: NEGATIVE
Glucose, UA: NEGATIVE mg/dL
Hgb urine dipstick: NEGATIVE
Ketones, ur: NEGATIVE mg/dL
Leukocytes,Ua: NEGATIVE
Nitrite: NEGATIVE
Protein, ur: NEGATIVE mg/dL
Specific Gravity, Urine: 1.02 (ref 1.005–1.030)
pH: 7 (ref 5.0–8.0)

## 2020-03-30 NOTE — MAU Note (Signed)
Pt reports to mau with c/o back pain that she believes is ctx.  Pt reports ctx q 15 min.  Denies LOF or vag bleeding.  Reports good fm

## 2020-03-30 NOTE — Progress Notes (Signed)
19 y/o female G2P1001 at [redacted]w[redacted]d who presented to MAU today complaining of contractions and back pain. Baseline 135/moderate variability/+accels, no decels on monitor. Ctx irregular. SVE is unchanged from most recent office visit. Patient is not in labor. Has follow up OBGYN appointment scheduled for 9/28. At this time pt can be discharged with strict labor and return precautions.

## 2020-03-30 NOTE — Discharge Instructions (Signed)
Braxton Hicks Contractions °Contractions of the uterus can occur throughout pregnancy, but they are not always a sign that you are in labor. You may have practice contractions called Braxton Hicks contractions. These false labor contractions are sometimes confused with true labor. °What are Braxton Hicks contractions? °Braxton Hicks contractions are tightening movements that occur in the muscles of the uterus before labor. Unlike true labor contractions, these contractions do not result in opening (dilation) and thinning of the cervix. Toward the end of pregnancy (32-34 weeks), Braxton Hicks contractions can happen more often and may become stronger. These contractions are sometimes difficult to tell apart from true labor because they can be very uncomfortable. You should not feel embarrassed if you go to the hospital with false labor. °Sometimes, the only way to tell if you are in true labor is for your health care provider to look for changes in the cervix. The health care provider will do a physical exam and may monitor your contractions. If you are not in true labor, the exam should show that your cervix is not dilating and your water has not broken. °If there are no other health problems associated with your pregnancy, it is completely safe for you to be sent home with false labor. You may continue to have Braxton Hicks contractions until you go into true labor. °How to tell the difference between true labor and false labor °True labor °· Contractions last 30-70 seconds. °· Contractions become very regular. °· Discomfort is usually felt in the top of the uterus, and it spreads to the lower abdomen and low back. °· Contractions do not go away with walking. °· Contractions usually become more intense and increase in frequency. °· The cervix dilates and gets thinner. °False labor °· Contractions are usually shorter and not as strong as true labor contractions. °· Contractions are usually irregular. °· Contractions  are often felt in the front of the lower abdomen and in the groin. °· Contractions may go away when you walk around or change positions while lying down. °· Contractions get weaker and are shorter-lasting as time goes on. °· The cervix usually does not dilate or become thin. °Follow these instructions at home: ° °· Take over-the-counter and prescription medicines only as told by your health care provider. °· Keep up with your usual exercises and follow other instructions from your health care provider. °· Eat and drink lightly if you think you are going into labor. °· If Braxton Hicks contractions are making you uncomfortable: °? Change your position from lying down or resting to walking, or change from walking to resting. °? Sit and rest in a tub of warm water. °? Drink enough fluid to keep your urine pale yellow. Dehydration may cause these contractions. °? Do slow and deep breathing several times an hour. °· Keep all follow-up prenatal visits as told by your health care provider. This is important. °Contact a health care provider if: °· You have a fever. °· You have continuous pain in your abdomen. °Get help right away if: °· Your contractions become stronger, more regular, and closer together. °· You have fluid leaking or gushing from your vagina. °· You pass blood-tinged mucus (bloody show). °· You have bleeding from your vagina. °· You have low back pain that you never had before. °· You feel your baby’s head pushing down and causing pelvic pressure. °· Your baby is not moving inside you as much as it used to. °Summary °· Contractions that occur before labor are   called Braxton Hicks contractions, false labor, or practice contractions. °· Braxton Hicks contractions are usually shorter, weaker, farther apart, and less regular than true labor contractions. True labor contractions usually become progressively stronger and regular, and they become more frequent. °· Manage discomfort from Braxton Hicks contractions  by changing position, resting in a warm bath, drinking plenty of water, or practicing deep breathing. °This information is not intended to replace advice given to you by your health care provider. Make sure you discuss any questions you have with your health care provider. °Document Revised: 06/03/2017 Document Reviewed: 11/04/2016 °Elsevier Patient Education © 2020 Elsevier Inc. ° °

## 2020-04-01 ENCOUNTER — Encounter: Payer: Self-pay | Admitting: *Deleted

## 2020-04-01 ENCOUNTER — Ambulatory Visit (INDEPENDENT_AMBULATORY_CARE_PROVIDER_SITE_OTHER): Payer: Medicaid Other | Admitting: Nurse Practitioner

## 2020-04-01 ENCOUNTER — Encounter: Payer: Self-pay | Admitting: Nurse Practitioner

## 2020-04-01 ENCOUNTER — Other Ambulatory Visit: Payer: Self-pay

## 2020-04-01 VITALS — BP 125/80 | HR 100 | Wt 214.3 lb

## 2020-04-01 DIAGNOSIS — O0990 Supervision of high risk pregnancy, unspecified, unspecified trimester: Secondary | ICD-10-CM

## 2020-04-01 DIAGNOSIS — F314 Bipolar disorder, current episode depressed, severe, without psychotic features: Secondary | ICD-10-CM

## 2020-04-01 NOTE — Patient Instructions (Signed)
[employer]

## 2020-04-01 NOTE — Progress Notes (Signed)
    Subjective:  Cheryl Harrell is a 19 y.o. G2P1001 at [redacted]w[redacted]d being seen today for ongoing prenatal care.  She is currently monitored for the following issues for this low-risk pregnancy and has Oppositional defiant disorder; Bipolar I disorder, most recent episode depressed, severe without psychotic features (HCC); Chronic post-traumatic stress disorder (PTSD); Attention deficit hyperactivity disorder (ADHD), combined type, moderate; Supervision of high risk pregnancy, antepartum; History of pre-eclampsia in prior pregnancy, currently pregnant in second trimester; Late prenatal care affecting pregnancy in second trimester; Short interval between pregnancies affecting pregnancy in second trimester, antepartum; History of maternal syphilis, 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  Patient reports contractions at night.  Contractions: Irritability. Vag. Bleeding: None.  Movement: Present. Denies leaking of fluid.   The following portions of the patient's history were reviewed and updated as appropriate: allergies, current medications, past family history, past medical history, past social history, past surgical history and problem list. Problem list updated.  Objective:   Vitals:   04/01/20 1314  BP: 125/80  Pulse: 100  Weight: 214 lb 4.8 oz (97.2 kg)    Fetal Status: Fetal Heart Rate (bpm): 144 Fundal Height: 38 cm Movement: Present  Presentation: Vertex  General:  Alert, oriented and cooperative. Patient is in no acute distress.  Skin: Skin is warm and dry. No rash noted.   Cardiovascular: Normal heart rate noted  Respiratory: Normal respiratory effort, no problems with respiration noted  Abdomen: Soft, gravid, appropriate for gestational age. Pain/Pressure: Present     Pelvic:  Cervical exam performed Dilation: Fingertip Effacement (%): Thick Station: -2  Extremities: Normal range of motion.  Edema: None  Mental Status: Normal mood and affect.  Normal behavior. Normal judgment and thought content.   Urinalysis:      Assessment and Plan:  Pregnancy: G2P1001 at [redacted]w[redacted]d  1. Supervision of high risk pregnancy, antepartum Plans water birth  Will schedule for induction at 41 weeks - discussed with client and she is in agreement. NST at visit next week and will need BPP scheduled.   2. Bipolar I disorder, most recent episode depressed, severe without psychotic features (HCC) Declines any counseling or referrals Is aware of her baseline on PHQ-9 (usually scores 24) and states if she worsens she will seek care - knows sites to get mental health care.  Term labor symptoms and general obstetric precautions including but not limited to vaginal bleeding, contractions, leaking of fluid and fetal movement were reviewed in detail with the patient. Please refer to After Visit Summary for other counseling recommendations.  Return in about 1 week (around 04/08/2020) for in person ROB and NST.  Nolene Bernheim, RN, MSN, NP-BC Nurse Practitioner, [facility] for [employer], [facility] 04/01/2020 1:55 PM

## 2020-04-02 NOTE — Addendum Note (Signed)
Addended by: Hulda Marin C on: 04/02/2020 01:08 PM   Modules accepted: Orders

## 2020-04-06 ENCOUNTER — Encounter (HOSPITAL_COMMUNITY): Payer: Self-pay | Admitting: Obstetrics and Gynecology

## 2020-04-06 ENCOUNTER — Other Ambulatory Visit: Payer: Self-pay

## 2020-04-06 ENCOUNTER — Inpatient Hospital Stay (HOSPITAL_COMMUNITY)
Admission: AD | Admit: 2020-04-06 | Discharge: 2020-04-06 | Disposition: A | Discharge status: Home / Self Care | Payer: Medicaid Other | Attending: Obstetrics and Gynecology | Admitting: Obstetrics and Gynecology

## 2020-04-06 DIAGNOSIS — Z3A39 39 weeks gestation of pregnancy: Secondary | ICD-10-CM | POA: Diagnosis not present

## 2020-04-06 DIAGNOSIS — O0990 Supervision of high risk pregnancy, unspecified, unspecified trimester: Secondary | ICD-10-CM | POA: Diagnosis present

## 2020-04-06 DIAGNOSIS — O0993 Supervision of high risk pregnancy, unspecified, third trimester: Secondary | ICD-10-CM | POA: Insufficient documentation

## 2020-04-06 DIAGNOSIS — O479 False labor, unspecified: Secondary | ICD-10-CM | POA: Insufficient documentation

## 2020-04-06 NOTE — Discharge Instructions (Signed)
Things to Try After 37 weeks to Encourage Labor/Get Ready for Labor:   1.  Try the [employer] at [email or web address].[email or web address] daily to improve baby's position and encourage the onset of labor.  2. Walk a little and rest a little every day.  Change positions often.  3. Cervical Ripening: May try one or both a. Red Raspberry Leaf capsules or tea:  two 300mg  or 400mg  tablets with each meal, 2-3 times a day, or 1-3 cups of tea daily  Potential Side Effects Of Raspberry Leaf:  Most women do not experience any side effects from drinking raspberry leaf tea. However, nausea and loose stools are possible   b. Evening Primrose Oil capsules: may take 1 to 3 capsules daily. Take 1-2 capsules by mouth each day and place one capsule vaginally at night.  You may also prick the vaginal capsule to release the oil prior to inserting in the vagina. Some of the potential side effects:  Upset stomach  Loose stools or diarrhea  Headaches  Nausea  4. Sex (and especially sex with orgasm) can also help the cervix ripen and encourage labor onset.    Reasons to return to [facility] at [facility]:  1.  Contractions are  5 minutes apart or less, each last 1 minute, these have been going on for 1-2 hours, and you cannot walk or talk during them 2.  You have a large gush of fluid, or a trickle of fluid that will not stop and you have to wear a pad 3.  You have bleeding that is bright red, heavier than spotting--like menstrual bleeding (spotting can be normal in early labor or after a check of your cervix) 4.  You do not feel the baby moving like he/she normally does

## 2020-04-06 NOTE — MAU Note (Signed)
Pt reports to MAU complaining of CTX every 5-6 minutes. Pt denies Vaginal bleeding or leaking of fluid.  +FM.

## 2020-04-06 NOTE — MAU Note (Signed)
I have communicated with Casper Harrison, CNM and reviewed vital signs:  Vitals:   04/06/20 0039 04/06/20 0221  BP: 122/75 116/69  Pulse: 100 80  Resp: 17   Temp: 99.2 F (37.3 C) 98.2 F (36.8 C)  SpO2: 100%     Vaginal exam:  Dilation: 1.5 Effacement (%): 50 Cervical Position: Middle Station: Ballotable Presentation: Vertex Exam by:: Irish Elders, RN,   Also reviewed contraction pattern and that non-stress test is reactive.  It has been documented that patient is contracting irregularly with no cervical change over 1 hour not indicating active labor.  Patient denies any other complaints.  Based on this report provider has given order for discharge.  A discharge order and diagnosis entered by a provider.   Labor discharge instructions reviewed with patient.

## 2020-04-07 ENCOUNTER — Ambulatory Visit (INDEPENDENT_AMBULATORY_CARE_PROVIDER_SITE_OTHER): Payer: Medicaid Other | Admitting: Obstetrics & Gynecology

## 2020-04-07 ENCOUNTER — Ambulatory Visit (INDEPENDENT_AMBULATORY_CARE_PROVIDER_SITE_OTHER): Payer: Medicaid Other | Admitting: *Deleted

## 2020-04-07 ENCOUNTER — Inpatient Hospital Stay (HOSPITAL_COMMUNITY): Admission: AD | Admit: 2020-04-07 | Payer: Medicaid Other | Source: Home / Self Care | Admitting: Family Medicine

## 2020-04-07 ENCOUNTER — Ambulatory Visit: Payer: Self-pay

## 2020-04-07 ENCOUNTER — Encounter (HOSPITAL_COMMUNITY): Payer: Self-pay | Admitting: *Deleted

## 2020-04-07 ENCOUNTER — Telehealth (HOSPITAL_COMMUNITY): Payer: Self-pay | Admitting: *Deleted

## 2020-04-07 VITALS — BP 118/77 | HR 81 | Temp 98.6°F

## 2020-04-07 DIAGNOSIS — O09892 Supervision of other high risk pregnancies, second trimester: Secondary | ICD-10-CM

## 2020-04-07 DIAGNOSIS — O480 Post-term pregnancy: Secondary | ICD-10-CM | POA: Diagnosis not present

## 2020-04-07 DIAGNOSIS — O09292 Supervision of pregnancy with other poor reproductive or obstetric history, second trimester: Secondary | ICD-10-CM

## 2020-04-07 DIAGNOSIS — O0990 Supervision of high risk pregnancy, unspecified, unspecified trimester: Secondary | ICD-10-CM

## 2020-04-07 LAB — POCT URINALYSIS DIP (DEVICE)
Bilirubin Urine: NEGATIVE
Glucose, UA: NEGATIVE mg/dL
Hgb urine dipstick: NEGATIVE
Ketones, ur: NEGATIVE mg/dL
Leukocytes,Ua: NEGATIVE
Nitrite: NEGATIVE
Protein, ur: NEGATIVE mg/dL
Specific Gravity, Urine: 1.01 (ref 1.005–1.030)
Urobilinogen, UA: 0.2 mg/dL (ref 0.0–1.0)
pH: 6 (ref 5.0–8.0)

## 2020-04-07 IMAGING — US US FETAL BPP W/ NON-STRESS
1 series · 14 of 15 positions shown · non-contrast
Comparison: none

[Series 1: us fetal bpp w/ non-stress · 15 acquisitions, 14 frames shown]
[im 1/15]
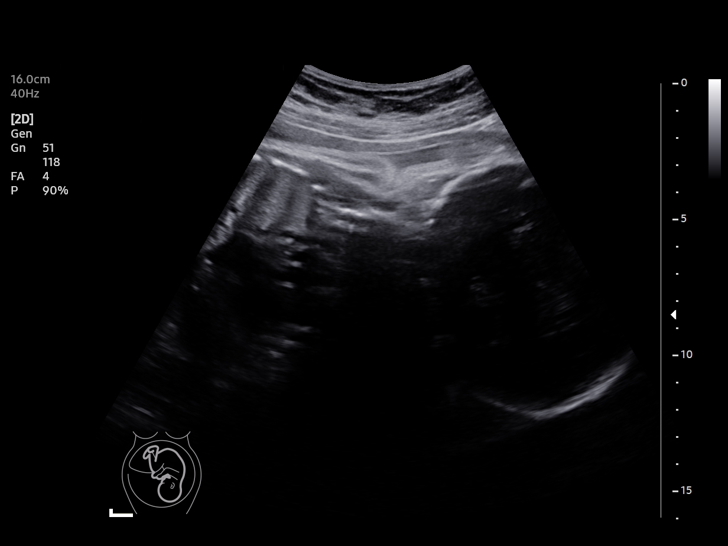
[im 2/15]
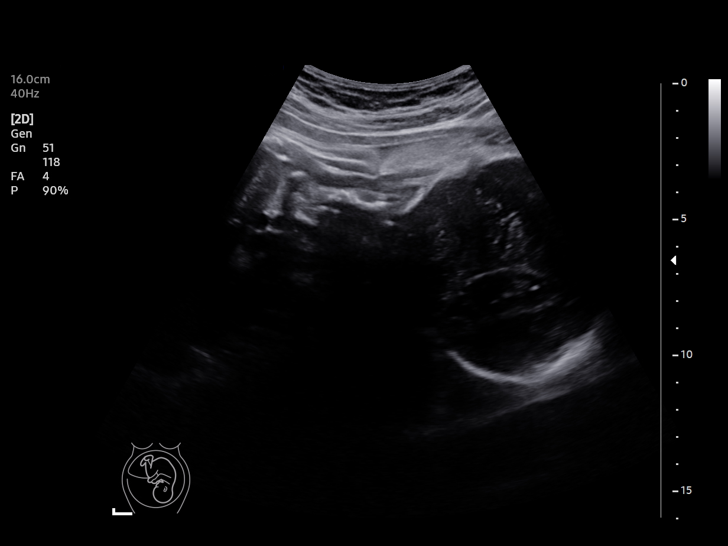
[im 3/15]
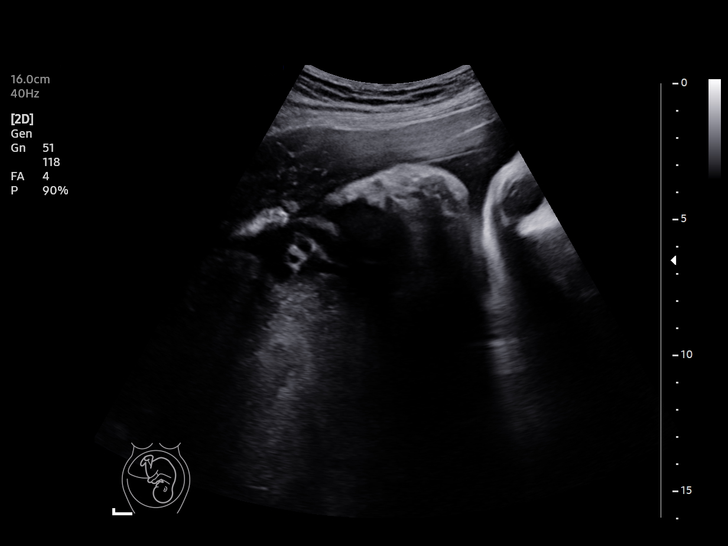
[im 4/15]
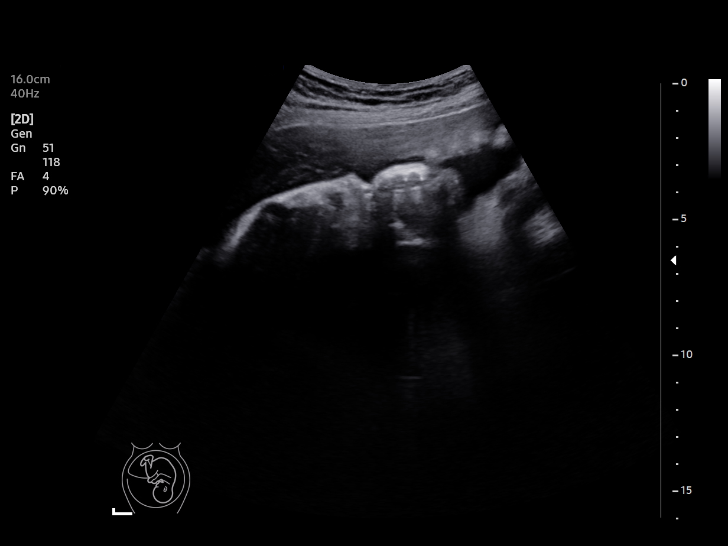
[im 5/15]
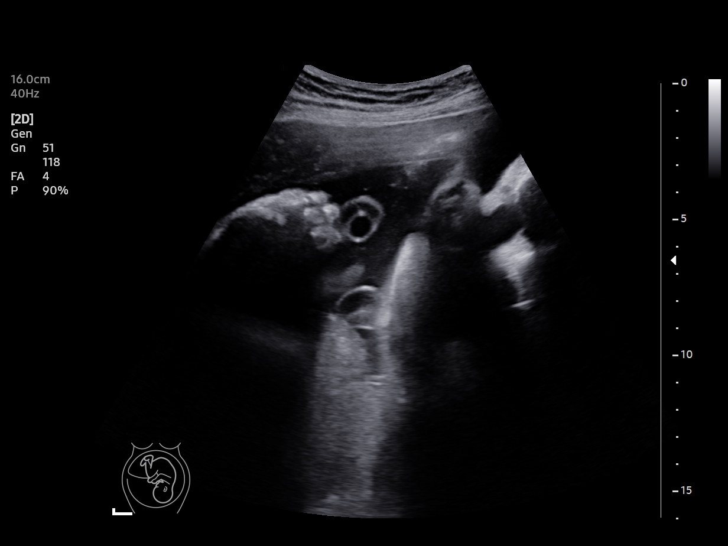
[im 6/15]
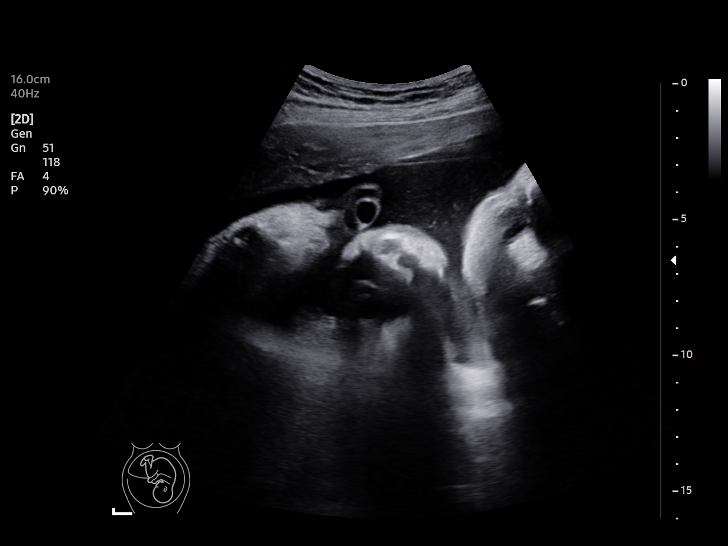
[im 7/15]
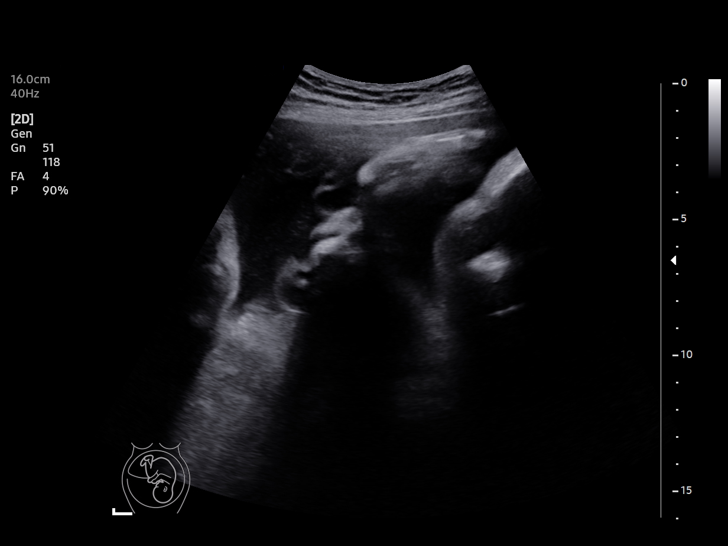
[im 9/15]
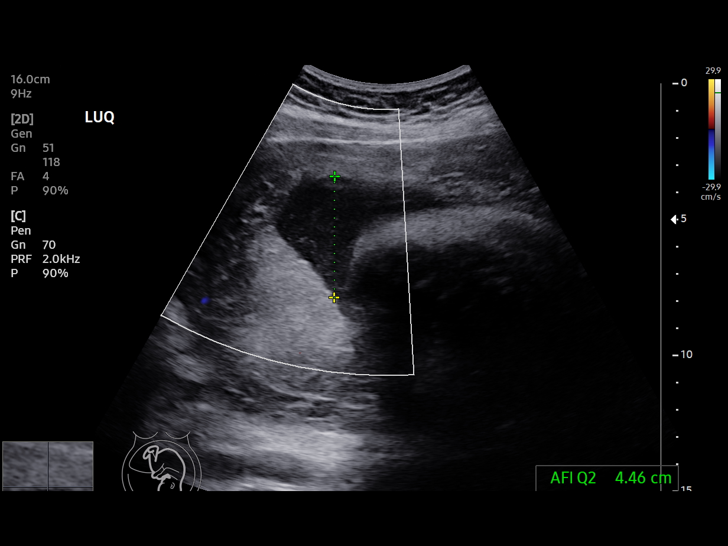
[im 10/15]
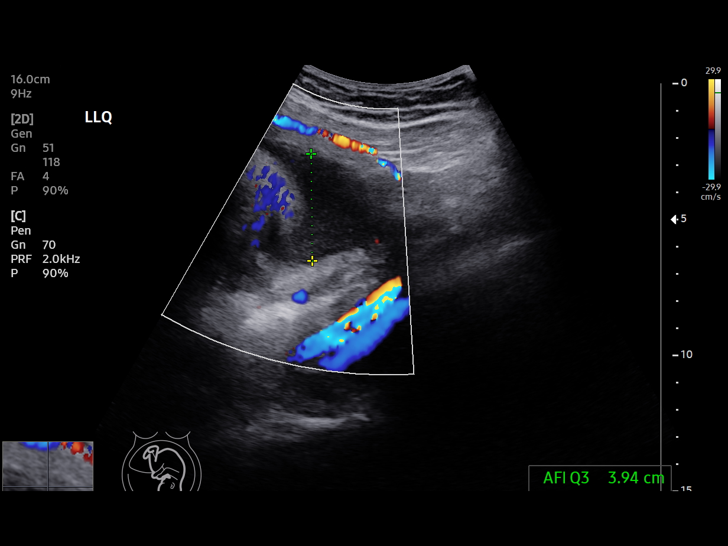
[im 11/15]
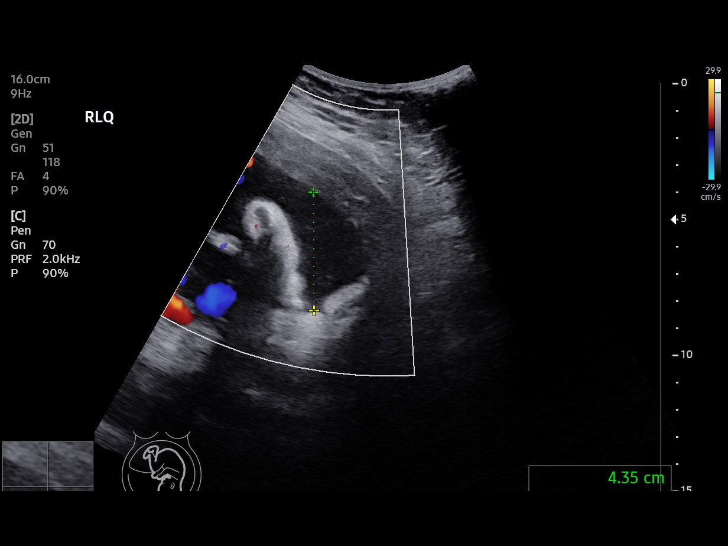
[im 12/15]
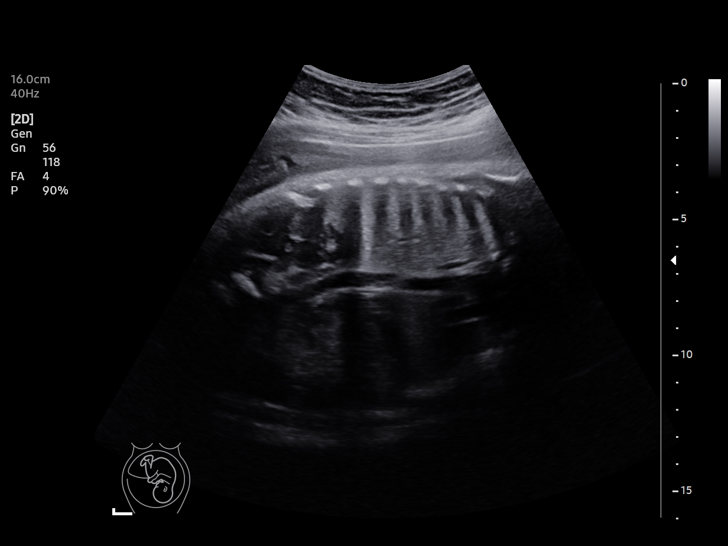
[im 13/15]
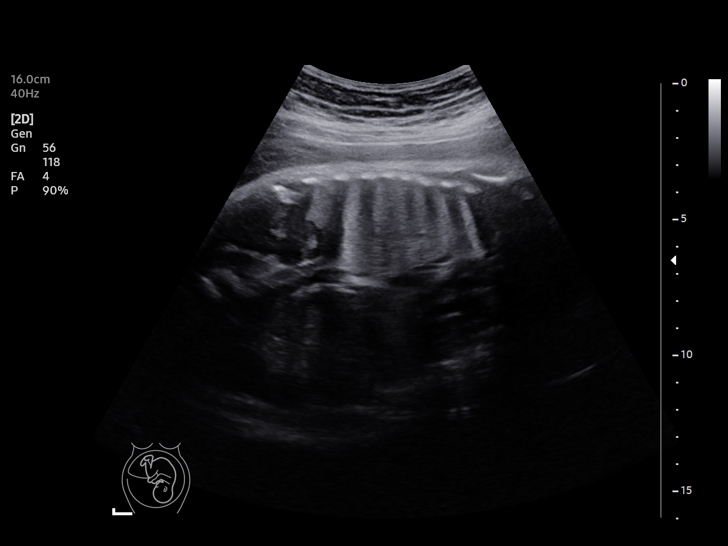
[im 14/15]
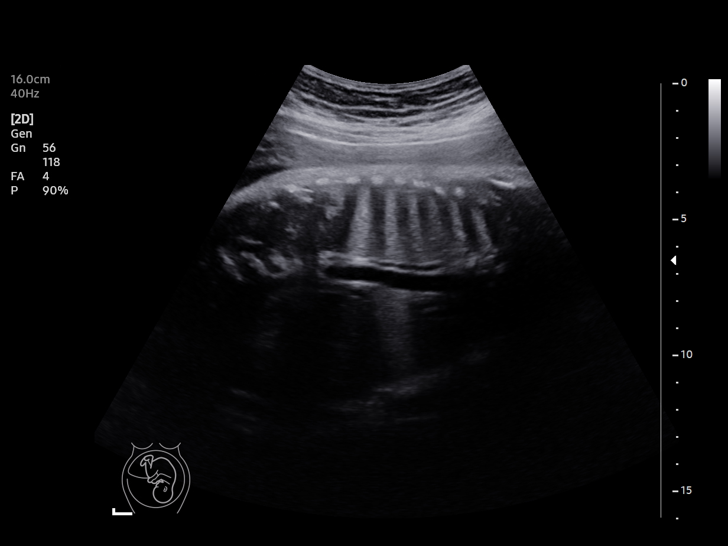
[im 15/15]
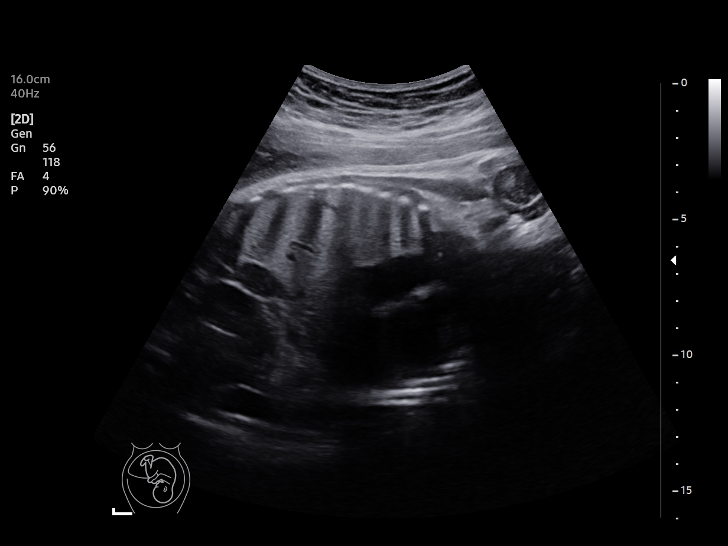

[14 of 15 positions shown; findings below may reference images not displayed]

[REDACTED]care at

 1  US FETAL BPP W/NONSTRESS              76818.4     KLPIG[facility] MOOLMAN

Service(s) Provided

Indications

 39 weeks gestation of pregnancy
 Postdate pregnancy (40-42 weeks)
Fetal Evaluation

 Num Of Fetuses:         1
 Preg. Location:         Intrauterine
 Cardiac Activity:       Observed
 Presentation:           Cephalic

 Amniotic Fluid
 AFI FV:      Within normal limits

 AFI Sum(cm)     %Tile       Largest Pocket(cm)
 19.9            86

 RUQ(cm)       RLQ(cm)       LUQ(cm)        LLQ(cm)

Biophysical Evaluation

 Amniotic F.V:   Pocket => 2 cm             F. Tone:        Observed
 F. Movement:    Observed                   N.S.T:          Reactive
 F. Breathing:   Observed                   Score:          [DATE]
OB History

 Gravidity:    2         Term:   1
 Living:       1
Gestational Age

 LMP:           39w 5d        Date:  07/04/19                 EDD:   04/09/20
 Best:          39w 5d     Det. By:  LMP  (07/04/19)          EDD:   04/09/20
Impression

 BPP [DATE] with reactive NST
Recommendations

 Continue weekly antenatal testing till delivery .

## 2020-04-07 NOTE — Telephone Encounter (Signed)
Preadmission screen  

## 2020-04-07 NOTE — Patient Instructions (Signed)

## 2020-04-07 NOTE — Progress Notes (Signed)
PT's screening scores are high today and have been on past visits. Has seen Asher Muir in the past but does not have a future appt. Pt states she is ok today. Reminded pt that sometimes in PP with hormone changes PP depression can occur. Pt aware she can always make appt with [facility] as she needs and can be done virtually. Pt voices understanding to above. Dr Debroah Loop aware

## 2020-04-07 NOTE — Progress Notes (Signed)
   PRENATAL VISIT NOTE  Subjective:  Cheryl Harrell is a 19 y.o. G2P1001 at [redacted]w[redacted]d being seen today for ongoing prenatal care.  She is currently monitored for the following issues for this high-risk pregnancy and has Oppositional defiant disorder; Bipolar I disorder, most recent episode depressed, severe without psychotic features (HCC); Chronic post-traumatic stress disorder (PTSD); Attention deficit hyperactivity disorder (ADHD), combined type, moderate; Supervision of high risk pregnancy, antepartum; History of pre-eclampsia in prior pregnancy, currently pregnant in second trimester; Late prenatal care affecting pregnancy in second trimester; Short interval between pregnancies affecting pregnancy in second trimester, antepartum; History of maternal syphilis, 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  Patient reports no complaints.  Contractions: Irregular. Vag. Bleeding: None.  Movement: Present. Denies leaking of fluid.   The following portions of the patient's history were reviewed and updated as appropriate: allergies, current medications, past family history, past medical history, past social history, past surgical history and problem list.   Objective:   Vitals:   04/07/20 1456  BP: 118/77  Pulse: 81  Temp: 98.6 F (37 C)    Fetal Status: Fetal Heart Rate (bpm): 132   Movement: Present     General:  Alert, oriented and cooperative. Patient is in no acute distress.  Skin: Skin is warm and dry. No rash noted.   Cardiovascular: Normal heart rate noted  Respiratory: Normal respiratory effort, no problems with respiration noted  Abdomen: Soft, gravid, appropriate for gestational age.  Pain/Pressure: Present     Pelvic: Cervical exam deferred        Extremities: Normal range of motion.  Edema: None  Mental Status: Normal mood and affect. Normal behavior. Normal judgment and thought content.   Assessment and Plan:  Pregnancy: G2P1001 at  [redacted]w[redacted]d There are no diagnoses linked to this encounter. Term labor symptoms and general obstetric precautions including but not limited to vaginal bleeding, contractions, leaking of fluid and fetal movement were reviewed in detail with the patient. Please refer to After Visit Summary for other counseling recommendations.   Return if symptoms worsen or fail to improve, for has IOL 10/11.  Future Appointments  Date Time Provider Department Center  04/12/2020 10:00 AM [facility]-SCREENING [facility] None  04/14/2020  6:30 AM MC-LD SCHED ROOM [facility]INDC None   Supervision of high risk pregnancy, antepartum  Short interval between pregnancies affecting pregnancy in second trimester, antepartum  History of pre-eclampsia in prior pregnancy, currently pregnant in second trimester  Has IOLM 41 weeks, BPP 10/10 today Scheryl Darter, MD

## 2020-04-08 ENCOUNTER — Other Ambulatory Visit: Payer: Self-pay | Admitting: Advanced Practice Midwife

## 2020-04-11 ENCOUNTER — Inpatient Hospital Stay (HOSPITAL_COMMUNITY)
Admission: AD | Admit: 2020-04-11 | Discharge: 2020-04-13 | DRG: 807 | Disposition: A | Discharge status: Home / Self Care | Payer: Medicaid Other | Attending: Obstetrics and Gynecology | Admitting: Obstetrics and Gynecology

## 2020-04-11 ENCOUNTER — Encounter (HOSPITAL_COMMUNITY): Payer: Self-pay | Admitting: Obstetrics and Gynecology

## 2020-04-11 ENCOUNTER — Other Ambulatory Visit: Payer: Self-pay

## 2020-04-11 DIAGNOSIS — Z20822 Contact with and (suspected) exposure to covid-19: Secondary | ICD-10-CM | POA: Diagnosis present

## 2020-04-11 DIAGNOSIS — O4202 Full-term premature rupture of membranes, onset of labor within 24 hours of rupture: Secondary | ICD-10-CM | POA: Diagnosis not present

## 2020-04-11 DIAGNOSIS — O480 Post-term pregnancy: Principal | ICD-10-CM | POA: Diagnosis present

## 2020-04-11 DIAGNOSIS — Z3A40 40 weeks gestation of pregnancy: Secondary | ICD-10-CM

## 2020-04-11 DIAGNOSIS — Z880 Allergy status to penicillin: Secondary | ICD-10-CM | POA: Diagnosis not present

## 2020-04-11 LAB — CBC
HCT: 42.1 % (ref 36.0–46.0)
Hemoglobin: 14.2 g/dL (ref 12.0–15.0)
MCH: 29.2 pg (ref 26.0–34.0)
MCHC: 33.7 g/dL (ref 30.0–36.0)
MCV: 86.4 fL (ref 80.0–100.0)
Platelets: 332 10*3/uL (ref 150–400)
RBC: 4.87 MIL/uL (ref 3.87–5.11)
RDW: 13 % (ref 11.5–15.5)
WBC: 13.1 10*3/uL — ABNORMAL HIGH (ref 4.0–10.5)
nRBC: 0 % (ref 0.0–0.2)

## 2020-04-11 LAB — TYPE AND SCREEN
ABO/RH(D): O POS
Antibody Screen: NEGATIVE

## 2020-04-11 LAB — RESPIRATORY PANEL BY RT PCR (FLU A&B, COVID)
Influenza A by PCR: NEGATIVE
Influenza B by PCR: NEGATIVE
SARS Coronavirus 2 by RT PCR: NEGATIVE

## 2020-04-11 MED ORDER — DIPHENHYDRAMINE HCL 25 MG PO CAPS: 25.0000 mg | ORAL_CAPSULE | Freq: Four times a day (QID) | ORAL | Status: DC | PRN

## 2020-04-11 MED ORDER — OXYTOCIN-SODIUM CHLORIDE 30-0.9 UT/500ML-% IV SOLN
2.5000 [IU]/h | INTRAVENOUS | Status: DC
  Filled 2020-04-11: qty 500

## 2020-04-11 MED ORDER — ACETAMINOPHEN 325 MG PO TABS
650.0000 mg | ORAL_TABLET | ORAL | Status: DC | PRN
  Administered 2020-04-11 – 2020-04-13 (×3): 650 mg via ORAL
  Filled 2020-04-11 (×2): qty 2

## 2020-04-11 MED ORDER — ACETAMINOPHEN 325 MG PO TABS
650.0000 mg | ORAL_TABLET | ORAL | Status: DC | PRN
  Filled 2020-04-11: qty 2

## 2020-04-11 MED ORDER — FLEET ENEMA 7-19 GM/118ML RE ENEM: 1.0000 | ENEMA | RECTAL | Status: DC | PRN

## 2020-04-11 MED ORDER — LIDOCAINE HCL (PF) 1 % IJ SOLN: 30.0000 mL | INTRAMUSCULAR | Status: DC | PRN

## 2020-04-11 MED ORDER — TETANUS-DIPHTH-ACELL PERTUSSIS 5-2.5-18.5 LF-MCG/0.5 IM SUSP: 0.5000 mL | Freq: Once | INTRAMUSCULAR | Status: DC

## 2020-04-11 MED ORDER — BENZOCAINE-MENTHOL 20-0.5 % EX AERO: 1.0000 "application " | INHALATION_SPRAY | CUTANEOUS | Status: DC | PRN

## 2020-04-11 MED ORDER — ONDANSETRON HCL 4 MG/2ML IJ SOLN: 4.0000 mg | Freq: Four times a day (QID) | INTRAMUSCULAR | Status: DC | PRN

## 2020-04-11 MED ORDER — OXYCODONE-ACETAMINOPHEN 5-325 MG PO TABS: 1.0000 | ORAL_TABLET | ORAL | Status: DC | PRN

## 2020-04-11 MED ORDER — SIMETHICONE 80 MG PO CHEW: 80.0000 mg | CHEWABLE_TABLET | ORAL | Status: DC | PRN

## 2020-04-11 MED ORDER — TERBUTALINE SULFATE 1 MG/ML IJ SOLN: 0.2500 mg | Freq: Once | INTRAMUSCULAR | Status: DC | PRN

## 2020-04-11 MED ORDER — ONDANSETRON HCL 4 MG PO TABS: 4.0000 mg | ORAL_TABLET | ORAL | Status: DC | PRN

## 2020-04-11 MED ORDER — DIBUCAINE (PERIANAL) 1 % EX OINT: 1.0000 "application " | TOPICAL_OINTMENT | CUTANEOUS | Status: DC | PRN

## 2020-04-11 MED ORDER — IBUPROFEN 600 MG PO TABS
600.0000 mg | ORAL_TABLET | Freq: Four times a day (QID) | ORAL | Status: DC
  Administered 2020-04-11 – 2020-04-13 (×7): 600 mg via ORAL
  Filled 2020-04-11 (×7): qty 1

## 2020-04-11 MED ORDER — COCONUT OIL OIL: 1.0000 "application " | TOPICAL_OIL | Status: DC | PRN

## 2020-04-11 MED ORDER — LACTATED RINGERS IV SOLN: 500.0000 mL | INTRAVENOUS | Status: DC | PRN

## 2020-04-11 MED ORDER — OXYTOCIN BOLUS FROM INFUSION: 333.0000 mL | Freq: Once | INTRAVENOUS | Status: DC

## 2020-04-11 MED ORDER — FENTANYL CITRATE (PF) 100 MCG/2ML IJ SOLN
INTRAMUSCULAR | Status: AC
  Administered 2020-04-11: 100 ug
  Filled 2020-04-11: qty 2

## 2020-04-11 MED ORDER — LACTATED RINGERS IV SOLN: INTRAVENOUS | Status: DC

## 2020-04-11 MED ORDER — OXYTOCIN 10 UNIT/ML IJ SOLN
INTRAMUSCULAR | Status: AC
  Filled 2020-04-11: qty 1

## 2020-04-11 MED ORDER — WITCH HAZEL-GLYCERIN EX PADS: 1.0000 "application " | MEDICATED_PAD | CUTANEOUS | Status: DC | PRN

## 2020-04-11 MED ORDER — SENNOSIDES-DOCUSATE SODIUM 8.6-50 MG PO TABS
2.0000 | ORAL_TABLET | ORAL | Status: DC
  Administered 2020-04-11: 2 via ORAL
  Filled 2020-04-11: qty 2

## 2020-04-11 MED ORDER — OXYCODONE-ACETAMINOPHEN 5-325 MG PO TABS: 2.0000 | ORAL_TABLET | ORAL | Status: DC | PRN

## 2020-04-11 MED ORDER — SOD CITRATE-CITRIC ACID 500-334 MG/5ML PO SOLN: 30.0000 mL | ORAL | Status: DC | PRN

## 2020-04-11 MED ORDER — OXYTOCIN 10 UNIT/ML IJ SOLN
10.0000 [IU] | Freq: Once | INTRAMUSCULAR | Status: AC
  Administered 2020-04-11: 10 [IU] via INTRAMUSCULAR

## 2020-04-11 MED ORDER — PRENATAL MULTIVITAMIN CH
1.0000 | ORAL_TABLET | Freq: Every day | ORAL | Status: DC
  Administered 2020-04-12 – 2020-04-13 (×2): 1 via ORAL
  Filled 2020-04-11 (×2): qty 1

## 2020-04-11 MED ORDER — ZOLPIDEM TARTRATE 5 MG PO TABS
5.0000 mg | ORAL_TABLET | Freq: Every evening | ORAL | Status: DC | PRN
  Filled 2020-04-11: qty 1

## 2020-04-11 MED ORDER — ONDANSETRON HCL 4 MG/2ML IJ SOLN: 4.0000 mg | INTRAMUSCULAR | Status: DC | PRN

## 2020-04-11 NOTE — MAU Note (Signed)
PT SAYS IS AN INDUCTION ON 10-11.  UC STRONG  SINCE 7 PM.  DENIES HSV AND MRSA.  GBS- NEG    WANTS WATERBIRTH.  PNC WITH  CLINIC-   1-2 CM.

## 2020-04-11 NOTE — H&P (Signed)
Cheryl Harrell is a 19 y.o. female, G2P1001 at 40.4 weeks, presenting for SOL. Patient receives care at [facility] and was supervised for a high risk pregnancy. Pregnancy and medical history significant for problems as listed below. She is GBS negative and expresses a desire for WB for pain management.  She is anticipating a female infant and requests Nexplanon for PP birth control method.     Patient Active Problem List   Diagnosis Date Noted  . Post-dates pregnancy 04/11/2020  . Positive RPR test 03/25/2020  . 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 Supervision of high risk pregnancy, antepartum 12/07/2019  . History of pre-eclampsia in prior pregnancy, currently pregnant in second trimester 12/07/2019  . Late prenatal care affecting pregnancy in second trimester 12/07/2019  . Short interval between pregnancies affecting pregnancy in second trimester, antepartum 12/07/2019  . Attention deficit hyperactivity disorder (ADHD), combined type, moderate 06/15/2014  . Chronic post-traumatic stress disorder (PTSD) 06/13/2014  . Oppositional defiant disorder 06/30/2013  . Bipolar I disorder, most recent episode depressed, severe without psychotic features (HCC) 06/30/2013    History of present pregnancy:  Last evaluation:  04/07/2020 in office with Dr. Marcie Bal.    Nursing Staff Provider  Office Location [facility] Dating   2nd trimester [place]   Language  English Anatomy US   Normal [place]  Flu Vaccine  Declined-03/25/20 Genetic Screen  NIPS: Low Risk Female   AFP: too late  TDaP vaccine   01/04/20 Hgb A1C or  GTT Early -  Third trimester Glucose, Fasting 65 - 91 mg/dL 75   Glucose, 1 hour 65 - 179 mg/dL 90   Glucose, 2 hour 65 - 152 mg/dL 77     Rhogam  NA   LAB RESULTS     Blood Type O/Positive/-- (06/04 1203)   Feeding Plan Breast Antibody Negative (06/04 1203)  Contraception Nexplanon  Rubella 2.54 (06/04 1203)  Circumcision N/A RPR Reactive (06/04  1203) reflex testing normal titer 1:2 at NOB and 28 wk labs  Pediatrician  List given HBsAg Negative (06/04 1203)   Support Person Cheryl Harrell (boyfriend)  HCVAb Negative  Prenatal Classes  HIV Non Reactive (06/04 1203)     BTL Consent NA GBS  (For PCN allergy, check sensitivities)   VBAC Consent NA Pap NA    Hgb Electro   Neg Horizon  BP Cuff Given 12/07/19 CF  Neg Horizon    SMA  Neg Horizon    Waterbirth  [X]  Class [X]  Consent [X]  CNM visit    Induction  [ ]  Orders Entered [ ] Foley Y/N      OB History    Gravida  2   Para  1   Term  1   Preterm  0   AB  0   Living  1     SAB  0   TAB  0   Ectopic  0   Multiple  0   Live Births  1             Past Medical History:  Diagnosis Date  . Anxiety   . Anxiety   . Asthma   . Bipolar 1 disorder (HCC)   . Depression   . Mood disorder (HCC)   . ODD (oppositional defiant disorder)    Past Surgical History:  Procedure Laterality Date  . TONSILLECTOMY    . wisdom tooth removal     Family History: family history includes Depression in her mother; Sleep disorder  in her mother. Social History:  reports that she is a non-smoker but has been exposed to tobacco smoke. She has never used smokeless tobacco. She reports that she does not drink alcohol and does not use drugs.   Prenatal Transfer Tool  Maternal Diabetes: No Genetic Screening: Normal Maternal Ultrasounds/Referrals: Normal Fetal Ultrasounds or other Referrals:  None Maternal Substance Abuse:  No Significant Maternal Medications:  Meds include: Other:  bASA Significant Maternal Lab Results: Group B Strep negative   Maternal Assessment:  ROS: +Contractions, -LOF, -Vaginal Bleeding, +Fetal Movement  All other systems reviewed and negative.    Allergies  Allergen Reactions  . Shellfish Allergy Other (See Comments)    Unknown, possibly not allergic anymore-per patient  . Penicillins Other (See Comments)    Blisters on tongue DID THE REACTION  INVOLVE: Swelling of the face/tongue/throat, SOB, or low BP? Yes Sudden or severe rash/hives, skin peeling, or the inside of the mouth or nose? No Did it require medical treatment? Yes When did it last happen?4 years ago If all above answers are "NO", may proceed with cephalosporin use.     Dilation: 6.5 Effacement (%): 90 Station: -2 Exam by:: K.Wilson,RN Blood pressure 126/84, pulse 73, temperature 98.8 F (37.1 C), resp. rate 20, height 5\' 1"  (1.549 m), weight 100.3 kg, unknown if currently breastfeeding.  Physical Exam Constitutional:      General: She is in acute distress.     Appearance: Normal appearance.  HENT:     Head: Normocephalic and atraumatic.  Eyes:     Conjunctiva/sclera: Conjunctivae normal.  Pulmonary:     Effort: Pulmonary effort is normal. No respiratory distress.  Abdominal:     Comments: Gravid  Musculoskeletal:     Cervical back: Normal range of motion.  Skin:    General: Skin is warm and dry.  Neurological:     General: No focal deficit present.     Mental Status: She is alert and oriented to person, place, and time.  Psychiatric:        Mood and Affect: Mood normal.        Behavior: Behavior normal.        Thought Content: Thought content normal.     Fetal Assessment: -Presentation: Vertex  FHR: 130 UCs:  Palpates moderate to strong SROM with large amt clear fluid    Assessment IUP at 40.4 weeks SOL GBS Negative 2nd Stage Labor  Plan: Patient presents to unit with urge to push. Provider at bedside and SROM occurs. Patient C/C/+2 and instructed to push. Patient delivered without incident-See Delivery Note Plan for outpatient Nexplanon. Admission orders per protocol.    , MSN 04/11/2020, 8:53 PM

## 2020-04-11 NOTE — Discharge Summary (Signed)
Postpartum Discharge Summary    Patient Name: Cheryl Harrell DOB: 2001/05/08 MRN: [id number]   Date of admission: 04/11/2020 Delivery date:04/11/2020  Delivering provider: Gavin Pound  Date of discharge: 04/13/2020  Admitting diagnosis: Post-dates pregnancy [O48.0] Intrauterine pregnancy: [redacted]w[redacted]d    Secondary diagnosis:  Principal Problem:   Post-dates pregnancy Active Problems:   SVD (spontaneous vaginal delivery) Reactive RPR- T.pall pending  Additional problems: Precipitous delivery     Discharge diagnosis: Term Pregnancy Delivered                                              Post partum procedures:none  Augmentation: N/A Complications: None  Hospital course: Onset of Labor With Vaginal Delivery      19y.o. yo G2P1001 at 483w4das admitted in Active Labor on 04/11/2020. Patient had an uncomplicated labor course as follows: She arrived on unit and delivered shortly after.  Patient reports contractions started at 1900. Membrane Rupture Time/Date: 8:57 PM ,04/11/2020   Delivery Method:Vaginal, Spontaneous  Episiotomy: None  Lacerations:  None  Patient had an uncomplicated postpartum course.  She is ambulating, tolerating a regular diet, passing flatus, and urinating well. Patient is discharged home in stable condition on 04/13/20.  Newborn Data: Birth date:04/11/2020  Birth time:9:00 PM  Gender:Female  Living status:Living  Apgars:8 ,9  Weight:2960 g   Magnesium Sulfate received: No BMZ received: No Rhophylac:No MMR:No T-DaP:Given prenatally Flu: No Transfusion:No  Physical exam  Vitals:   04/12/20 1221 04/12/20 1613 04/12/20 1927 04/13/20 0519  BP: 126/74 114/75 (!) 140/94 (!) 128/95  Pulse: 74 62 66 61  Resp: _0 Temp: 98.1 F (36.7 C) 98.6 F (37 C) 98.2 F (36.8 C) 98.8 F (37.1 C)  TempSrc: Oral Oral Oral   SpO2: 100% 100% 99% 100%  Weight:      Height:       General: alert, cooperative and no distress Lochia: appropriate Uterine  Fundus: firm DVT Evaluation: No evidence of DVT seen on physical exam. No significant calf/ankle edema. Calf/Ankle edema is present Labs: Lab Results  Component Value Date   WBC 13.1 (H) 04/11/2020   HGB 14.2 04/11/2020   HCT 42.1 04/11/2020   MCV 86.4 04/11/2020   PLT 332 04/11/2020   CMP Latest Ref Rng & Units 12/07/2019  Glucose 65 - 99 mg/dL 76  BUN 6 - 20 mg/dL 7  Creatinine 0.57 - 1.00 mg/dL 0.46(L)  Sodium 134 - 144 mmol/L 139  Potassium 3.5 - 5.2 mmol/L 4.2  Chloride 96 - 106 mmol/L 105  CO2 20 - 29 mmol/L 20  Calcium 8.7 - 10.2 mg/dL 8.8  Total Protein 6.0 - 8.5 g/dL 5.9(L)  Total Bilirubin 0.0 - 1.2 mg/dL 0.2  Alkaline Phos 45 - 106 IU/L 76  AST 0 - 40 IU/L 10  ALT 0 - 32 IU/L 4   Edinburgh Score: Edinburgh Postnatal Depression Scale Screening Tool 04/12/2020  I have been able to laugh and see the funny side of things. (No Data)     After visit meds:  Allergies as of 04/13/2020      Reactions   Shellfish Allergy Other (See Comments)   Unknown, possibly not allergic anymore-per patient   Penicillins Other (See Comments)   Blisters on tongue DID THE REACTION INVOLVE: Swelling of the face/tongue/throat, SOB, or low BP? Yes Sudden or  severe rash/hives, skin peeling, or the inside of the mouth or nose? No Did it require medical treatment? Yes When did it last happen?4 years ago If all above answers are "NO", may proceed with cephalosporin use.      Medication List    TAKE these medications   acetaminophen 500 MG tablet Commonly known as: TYLENOL Take 1,000 mg by mouth every 6 (six) hours as needed for mild pain or headache.   aspirin EC 81 MG tablet Take 1 tablet (81 mg total) by mouth daily.   BL EVENING PRIMROSE OIL PO Take 1 capsule by mouth daily.   EQL EVENING PRIMROSE OIL PO Place 1 capsule vaginally.   Comfort Fit Maternity Supp Lg Misc 1 Units by Does not apply route daily as needed.   diphenhydrAMINE 25 mg capsule Commonly known as:  BENADRYL Take 25 mg by mouth every 6 (six) hours as needed for allergies.   docusate sodium 100 MG capsule Commonly known as: Colace Take 1 capsule (100 mg total) by mouth 2 (two) times daily as needed for mild constipation.   ondansetron 8 MG disintegrating tablet Commonly known as: Zofran ODT Take 1 tablet (8 mg total) by mouth every 8 (eight) hours as needed for nausea or vomiting.   Pepto-Bismol 262 MG chewable tablet Generic drug: bismuth subsalicylate Chew 524 mg by mouth as needed.   Prenatal Vitamins 0.8 MG tablet Take 1 tablet by mouth daily.        Discharge home in stable condition Infant Feeding: Breast Infant Disposition:home with mother Discharge instruction: per After Visit Summary and Postpartum booklet. Activity: Advance as tolerated. Pelvic rest for 6 weeks.  Diet: routine diet Future Appointments: No future appointments. Follow up Visit: Message Sent   Please schedule this patient for a In person postpartum visit in 4 weeks with the following provider: Any provider. Additional Postpartum F/U:None Currently  High risk pregnancy complicated by: Obesity, Short Interval b/t Pregnancies, H./O Anxiety/Depression Delivery mode:  Vaginal, Spontaneous  Anticipated Birth Control:  Nexplanon  04/13/2020 Rachel E Kim, MD  Midwife attestation I have seen and examined this patient and agree with above documentation in the resident's note.   Christen Shugrue is a 19 y.o. G2P2002 s/p SVD.  Pain is well controlled. Plan for birth control is Nexplanon. Method of Feeding: breast  PE:  Gen: well appearing Heart: reg rate Lungs: normal WOB Fundus firm Ext: no pain, no edema  Recent Labs    04/11/20 2033  HGB 14.2  HCT 42.1   Assessment S/p SVD PPD # 2 Hx of anxiety/depression  Plan: - discharge home - postpartum care discussed - f/u in office in 6 weeks for postpartum visit - f/u in office in 1-2 weeks for mood check - planning Nexplanon placement at  pp visit    , CNM 10:36 AM  

## 2020-04-12 ENCOUNTER — Other Ambulatory Visit (HOSPITAL_COMMUNITY): Admission: RE | Admit: 2020-04-12 | Payer: Medicaid Other | Source: Ambulatory Visit

## 2020-04-12 LAB — RPR
RPR Ser Ql: REACTIVE — AB
RPR Titer: 1:4 {titer}

## 2020-04-12 NOTE — Progress Notes (Signed)
Cheryl Harrell  Post Partum Day One:S/P SVD  Subjective: Patient up ad lib, denies syncope or dizziness. Reports consuming regular diet without issues and denies N/V. Denies issues with urination and reports bleeding is "fine."  Patient is breastfeeding and reports going well.  Desires Nexplanon for postpartum contraception.  Pain is being appropriately managed with use of ibuprofen.  Objective: Vitals:   04/11/20 2215 04/11/20 2243 04/11/20 2356 04/12/20 0356  BP: 122/64 124/89 112/78 125/70  Pulse: 74 63 88 72  Resp: 18 18 18 18   Temp:  98.6 F (37 C) 98.1 F (36.7 C) 98 F (36.7 C)  TempSrc:  Oral Oral Oral  SpO2:  100% 100% 100%  Weight:      Height:       Recent Labs    04/11/20 2033  HGB 14.2  HCT 42.1    Physical Exam:  General: alert, cooperative and no distress Mood/Affect: Appropriate/Bright Lungs: clear to auscultation, no wheezes, rales or rhonchi, symmetric air entry.  Heart: normal rate and regular rhythm. Breast: lactating, no erythema or tenderness, nipples normal. Abdomen:  + bowel sounds, Soft, NT Uterine Fundus: firm at umbilicus  Lochia: appropriate Laceration: None Skin: Warm, Dry DVT Evaluation: No evidence of DVT seen on physical exam. No significant calf/ankle edema.  Assessment S/P Vaginal Delivery-Day One Normal [facility] BreastFeeding  Plan: Discussed possible discharge tomorrow Encouraged to ambulate Lactation services as needed Continue current care  2034, MSN, CNM 04/12/2020, 7:54 AM

## 2020-04-12 NOTE — Clinical Social Work Maternal (Signed)
CLINICAL SOCIAL WORK MATERNAL/CHILD NOTE  Patient Details  Name: Cheryl Harrell MRN: [id number] Date of Birth: 22-Nov-2000  Date:  04/12/2020  Clinical Social Worker Initiating Note:  Abundio Miu, [place] Date/Time: Initiated:  04/12/20/1105     Child's Name:  Arsenio Katz   Biological Parents:  Mother, Father (Father: Leary Roca)   Need for Interpreter:  None   Reason for Referral:  Behavioral Health Concerns   Address:  [place] [address]    Phone number:  [phone] (home)     Additional phone number:   Household Members/Support Persons (HM/SP):   Household Member/Support Person 1, Household Member/Support Person 2, Household Member/Support Person 3, Household Member/Support Person 4, Household Member/Support Person 5   HM/SP Name Relationship DOB or Age  HM/SP -1 Tariq Psychiatric nurse FOB    HM/SP -2   FOB's mom    HM/SP -3   FOB's brother    HM/SP -51   FOB's brother    HM/SP -5 Annamarie Major daughter 12/02/2018  HM/SP -6        HM/SP -7        HM/SP -8          Natural Supports (not living in the home):  Parent, Immediate Family   Professional Supports: None   Employment: Unemployed   Type of Work:     Education:  Programmer, systems   Homebound arranged:    Museum/gallery curator Resources:  [employer]   Other Resources:  [employer], Physicist, medical    Cultural/Religious Considerations Which May Impact Care:    Strengths:  Ability to meet basic needs , Home prepared for child    Psychotropic Medications:         Pediatrician:       Pediatrician List:   [place]      Pediatrician Fax Number:    Risk Factors/Current Problems:  Mental Health Concerns    Cognitive State:  Able to Concentrate , Alert , Goal Oriented , Insightful , Linear Thinking    Mood/Affect:  Comfortable , Calm , Interested , Happy    CSW Assessment: CSW met with MOB at  bedside to discuss consult for behavioral health concerns. CSW introduced self and explained reason for consult. MOB was welcoming, pleasant, open and remained engaged during assessment. MOB reported that she resides with FOB, FOB's mother, FOB's 2 brothers and her daughter. MOB reported that they are moving soon noting that their apartment will be ready 04/25/2020. MOB reported that she is ready to move. MOB reported that she is currently unemployed and receives both [facility] and food stamps. MOB reported that they have everything needed to care for infant including a car seat and basinet. CSW inquired about MOB's support system, MOB reported that FOB, FOB's mom, her mom and aunt are supports. MOB shared that her mom does not live her but her aunt is local.   CSW inquired about MOB's mental health history. MOB reported that she has been diagnosed with depression, anxiety, PTSD and Bipolar. MOB reported that she was diagnosed in middle school and shared that her first hospitalization was in the 6th Grade. MOB reported that she is not currently taking any medication or participating in therapy to treat her mental health diagnoses. MOB reported that she knows how to cope and control her feelings better now that she is older. MOB reported  that she is interested in restarting medication but wants to be reevaluated first. CSW validated MOB's desire to be reevaluated before starting any medication. CSW provided MOB with local mental health resources and encouraged MOB to follow up. CSW inquired about any barriers to getting treatment for her mental health, MOB reported none. MOB shared that her depression gets worse towards the end of the year. CSW and MOB discussed seasonal depression and CSW emphasized the importance of MOB following up with a mental health provider as the end of the year is approaching, MOB verbalized understanding. CSW informed MOB that her OBGYN office has behavioral health specialist on staff and  inquired about MOB's interest in following up. MOB reported that she is interested, CSW agreed to make a referral to [employer] Specialist at [facility] office. MOB described her main mental health symptoms as depression and anxiety attacks. MOB described her depressive symptoms as being sad, isolating and loss of interest. MOB reported that her symptoms don't interfere with her daily living activities but impact her sleep. MOB reported that her symptoms come and go. CSW inquired about how MOB is currently coping with symptoms, MOB reported that she writes and talks to her boyfriend. MOB's boyfriend entered the room. MOB granted CSW verbal permission to continue assessment with FOB present. CSW inquired about how MOB was feeling emotionally after giving birth, MOB reported that she was feeling real good. MOB presented calm and appeared attached and bonded with infant as evidenced by care provided to infant and attentiveness during assessment. MOB possessed some insight about her mental health and did not demonstrate any acute mental health signs/symptoms. CSW assessed for safety, MOB denied SI and HI. CSW did not assess for domestic violence as FOB was present. CSW informed MOB that she may be more susceptible to postpartum depression due to her mental health history, MOB verbalized understanding.   CSW provided education regarding the baby blues period vs. perinatal mood disorders, discussed treatment and gave resources for mental health follow up if concerns arise.  CSW recommends self-evaluation during the postpartum time period using the New Mom Checklist from Postpartum Progress and encouraged MOB to contact a medical professional if symptoms are noted at any time.    CSW provided review of Sudden Infant Death Syndrome (SIDS) precautions.    CSW identifies no further need for intervention and no barriers to discharge at this time.   CSW Plan/Description:  No Further Intervention Required/No Barriers  to Discharge, Sudden Infant Death Syndrome (SIDS) Education, Perinatal Mood and Anxiety Disorder (PMADs) Education, Other Information/Referral to [employer], LCSW 04/12/2020, 11:07 AM

## 2020-04-12 NOTE — Progress Notes (Signed)
RPR came back reactive. CNM notified, patient is with visitor has not been made aware at this time.

## 2020-04-12 NOTE — Progress Notes (Signed)
Results discussed with pt. Patient states she has previously had a false positive RPR. Told the patient a plan of care would be discussed with [facility] peds and I would come back and give her an update.

## 2020-04-13 MED ORDER — IBUPROFEN 600 MG PO TABS: 600.0000 mg | ORAL_TABLET | Freq: Four times a day (QID) | ORAL | 0 refills | Status: DC

## 2020-04-13 NOTE — Discharge Instructions (Signed)

## 2020-04-14 ENCOUNTER — Inpatient Hospital Stay (HOSPITAL_COMMUNITY): Payer: Medicaid Other

## 2020-04-14 ENCOUNTER — Inpatient Hospital Stay (HOSPITAL_COMMUNITY): Admission: AD | Admit: 2020-04-14 | Payer: Medicaid Other | Source: Home / Self Care | Admitting: Family Medicine

## 2020-04-14 LAB — T.PALLIDUM AB, TOTAL: T Pallidum Abs: NONREACTIVE

## 2020-04-20 ENCOUNTER — Other Ambulatory Visit: Payer: Self-pay

## 2020-04-21 NOTE — BH Specialist Note (Signed)
Pt did not arrive to video visit and did not answer the phone ; .lefhippa ; left MyChart message for patient.

## 2020-04-28 ENCOUNTER — Ambulatory Visit: Payer: Medicaid Other | Admitting: Clinical

## 2020-04-28 DIAGNOSIS — Z5329 Procedure and treatment not carried out because of patient's decision for other reasons: Secondary | ICD-10-CM

## 2020-04-28 DIAGNOSIS — Z91199 Patient's noncompliance with other medical treatment and regimen due to unspecified reason: Secondary | ICD-10-CM

## 2020-05-20 ENCOUNTER — Encounter: Payer: Self-pay | Admitting: Family Medicine

## 2020-05-20 ENCOUNTER — Ambulatory Visit: Payer: Medicaid Other | Admitting: Advanced Practice Midwife

## 2020-06-30 ENCOUNTER — Other Ambulatory Visit: Payer: Medicaid Other

## 2020-06-30 DIAGNOSIS — Z20822 Contact with and (suspected) exposure to covid-19: Secondary | ICD-10-CM

## 2020-07-01 LAB — NOVEL CORONAVIRUS, NAA: SARS-CoV-2, NAA: NOT DETECTED

## 2020-07-01 LAB — SARS-COV-2, NAA 2 DAY TAT

## 2020-07-16 ENCOUNTER — Ambulatory Visit (INDEPENDENT_AMBULATORY_CARE_PROVIDER_SITE_OTHER): Payer: Medicaid Other | Admitting: Advanced Practice Midwife

## 2020-07-16 ENCOUNTER — Encounter: Payer: Self-pay | Admitting: Advanced Practice Midwife

## 2020-07-16 ENCOUNTER — Other Ambulatory Visit (HOSPITAL_COMMUNITY)
Admission: RE | Admit: 2020-07-16 | Discharge: 2020-07-16 | Disposition: A | Discharge status: Home / Self Care | Payer: Medicaid Other | Source: Ambulatory Visit | Attending: Advanced Practice Midwife | Admitting: Advanced Practice Midwife

## 2020-07-16 ENCOUNTER — Other Ambulatory Visit: Payer: Self-pay

## 2020-07-16 VITALS — BP 107/74 | HR 84 | Wt 220.9 lb

## 2020-07-16 DIAGNOSIS — Z30017 Encounter for initial prescription of implantable subdermal contraceptive: Secondary | ICD-10-CM | POA: Diagnosis not present

## 2020-07-16 DIAGNOSIS — Z0000 Encounter for general adult medical examination without abnormal findings: Secondary | ICD-10-CM | POA: Diagnosis not present

## 2020-07-16 DIAGNOSIS — N942 Vaginismus: Secondary | ICD-10-CM | POA: Diagnosis not present

## 2020-07-16 DIAGNOSIS — Z01419 Encounter for gynecological examination (general) (routine) without abnormal findings: Secondary | ICD-10-CM | POA: Diagnosis not present

## 2020-07-16 DIAGNOSIS — Z3202 Encounter for pregnancy test, result negative: Secondary | ICD-10-CM | POA: Diagnosis not present

## 2020-07-16 DIAGNOSIS — Z23 Encounter for immunization: Secondary | ICD-10-CM | POA: Diagnosis not present

## 2020-07-16 LAB — POCT PREGNANCY, URINE: Preg Test, Ur: NEGATIVE

## 2020-07-16 MED ORDER — ETONOGESTREL 68 MG ~~LOC~~ IMPL
68.0000 mg | DRUG_IMPLANT | Freq: Once | SUBCUTANEOUS | Status: AC
  Administered 2020-07-16: 68 mg via SUBCUTANEOUS

## 2020-07-16 NOTE — Addendum Note (Signed)
Addended by: Marjo Bicker on: 07/16/2020 05:24 PM   Modules accepted: Orders

## 2020-07-16 NOTE — Patient Instructions (Signed)
Nexplanon Instructions After Insertion  Keep bandage clean and dry for 24 hours  May use ice/Tylenol/Ibuprofen for soreness or pain  If you develop fever, drainage or increased warmth from incision site-contact office immediately   

## 2020-07-16 NOTE — Progress Notes (Signed)
GYNECOLOGY ANNUAL PREVENTATIVE CARE ENCOUNTER NOTE  Subjective:   Cheryl Harrell is a 20 y.o. G12P2002 female here for a routine annual gynecologic exam.  Current complaints: none.   Denies abnormal vaginal bleeding, discharge, pelvic pain, or other gynecologic concerns.   Patient reports that during penis/vagina intercourse she has very limited sensation and she wonders if this is normal. She reports that she has not had an orgasm during penis/vagina intercourse, but that she is able to orgasm with clitoral stimulation.    Gynecologic History No LMP recorded. Contraception: none Last Pap: NA, age. Results were: NA  Last mammogram: NA age  Obstetric History OB History  Gravida Para Term Preterm AB Living  2 2 2  0 0 2  SAB IAB Ectopic Multiple Live Births  0 0 0 0 2    # Outcome Date GA Lbr Len/2nd Weight Sex Delivery Anes PTL Lv  2 Term 04/11/20 [redacted]w[redacted]d 00:20 / 00:03 6 lb 8.4 oz (2.96 kg) F Vag-Spont None  LIV     Birth Comments: wdl  1 Term 12/02/18 [redacted]w[redacted]d    Vag-Spont        Complications: Pre-eclampsia in third trimester    Past Medical History:  Diagnosis Date  . Anxiety   . Anxiety   . Asthma   . Bipolar 1 disorder (HCC)   . Depression   . Mood disorder (HCC)   . ODD (oppositional defiant disorder)   . Short interval between pregnancies affecting pregnancy in second trimester, antepartum 12/07/2019   Last child born 12/02/2018   . Supervision of high risk pregnancy, antepartum 12/07/2019    Nursing Staff Provider Office Location [facility] Dating   2nd trimester 02/06/2020  Language  English Anatomy US   Normal [place] Flu Vaccine  Declined-03/25/20 Genetic Screen  NIPS: Low Risk Female   AFP: too late TDaP vaccine   01/04/20 Hgb A1C or  GTT Early -  Third trimester  Glucose, Fasting 65 - 91 mg/dL 75  Glucose, 1 hour 65 - 179 mg/dL 90  Glucose, 2 hour 65 - 152 mg/dL 77   Rhogam  NA   LAB RESULTS    Bl    Past Surgical History:  Procedure Laterality Date  . TONSILLECTOMY    . wisdom tooth  removal      Current Outpatient Medications on File Prior to Visit  Medication Sig Dispense Refill  . bismuth subsalicylate (PEPTO BISMOL) 262 MG chewable tablet Chew 524 mg by mouth as needed.    . Prenatal Multivit-Min-Fe-FA (PRENATAL VITAMINS) 0.8 MG tablet Take 1 tablet by mouth daily.     No current facility-administered medications on file prior to visit.    Allergies  Allergen Reactions  . Shellfish Allergy Other (See Comments)    Unknown, possibly not allergic anymore-per patient  . Penicillins Other (See Comments)    Blisters on tongue DID THE REACTION INVOLVE: Swelling of the face/tongue/throat, SOB, or low BP? Yes Sudden or severe rash/hives, skin peeling, or the inside of the mouth or nose? No Did it require medical treatment? Yes When did it last happen?4 years ago If all above answers are "NO", may proceed with cephalosporin use.    Social History   Socioeconomic History  . Marital status: Single    Spouse name: Not on file  . Number of children: Not on file  . Years of education: Not on file  . Highest education level: Not on file  Occupational History  . Not on file  Tobacco Use  .  Smoking status: Passive Smoke Exposure - Never Smoker  . Smokeless tobacco: Never Used  Vaping Use  . Vaping Use: Never used  Substance and Sexual Activity  . Alcohol use: No  . Drug use: No  . Sexual activity: Yes    Birth control/protection: None  Other Topics Concern  . Not on file  Social History Narrative  . Not on file   Social Determinants of Health   Financial Resource Strain: Not on file  Food Insecurity: No Food Insecurity  . Worried About Programme researcher, broadcasting/film/video in the Last Year: Never true  . Ran Out of Food in the Last Year: Never true  Transportation Needs: No Transportation Needs  . Lack of Transportation (Medical): No  . Lack of Transportation (Non-Medical): No  Physical Activity: Not on file  Stress: Not on file  Social Connections: Not on file   Intimate Partner Violence: Not on file    Family History  Problem Relation Age of Onset  . Depression Mother   . Sleep disorder Mother   . Diabetes Neg Hx   . Heart failure Neg Hx   . Hyperlipidemia Neg Hx     The following portions of the patient's history were reviewed and updated as appropriate: allergies, current medications, past family history, past medical history, past social history, past surgical history and problem list.  Review of Systems Pertinent items noted in HPI and remainder of comprehensive ROS otherwise negative.   Objective:  BP 107/74   Pulse 84   Wt 220 lb 14.4 oz (100.2 kg)   Breastfeeding Yes   BMI 41.74 kg/m  CONSTITUTIONAL: Well-developed, well-nourished female in no acute distress.  HENT:  Normocephalic, atraumatic, External right and left ear normal. Oropharynx is clear and moist EYES: Conjunctivae and EOM are normal. Pupils are equal, round, and reactive to light. No scleral icterus.  NECK: Normal range of motion, supple, no masses.  Normal thyroid.  SKIN: Skin is warm and dry. No rash noted. Not diaphoretic. No erythema. No pallor. NEUROLOGIC: Alert and oriented to person, place, and time. Normal reflexes, muscle tone coordination. No cranial nerve deficit noted. PSYCHIATRIC: Normal mood and affect. Normal behavior. Normal judgment and thought content. CARDIOVASCULAR: Normal heart rate noted, regular rhythm RESPIRATORY: Clear to auscultation bilaterally. Effort and breath sounds normal, no problems with respiration noted. ABDOMEN: Soft, normal bowel sounds, no distention noted.  No tenderness, rebound or guarding.  PELVIC: Normal appearing external genitalia; normal appearing vaginal mucosa and cervix.  No abnormal discharge noted.  Pap smear obtained.  Normal uterine size, no other palpable masses, no uterine or adnexal tenderness. MUSCULOSKELETAL: Normal range of motion. No tenderness.  No cyanosis, clubbing, or edema.  2+ distal  pulses.   Results for orders placed or performed in visit on 07/16/20 (from the past 24 hour(s))  Pregnancy, urine POC     Status: None   Collection Time: 07/16/20 10:28 AM  Result Value Ref Range   Preg Test, Ur NEGATIVE NEGATIVE     GYNECOLOGY OFFICE PROCEDURE NOTE  Nexplanon Insertion Procedure Patient identified, informed consent performed, consent signed.   Patient does understand that irregular bleeding is a very common side effect of this medication. She was advised to have backup contraception for one week after placement. Pregnancy test in clinic today was negative.  Appropriate time out taken.  Patient's left arm was prepped and draped in the usual sterile fashion. The ruler used to measure and mark insertion area.  Patient was prepped with alcohol  swab and then injected with 3 ml of 1% lidocaine.  She was prepped with betadine, Nexplanon removed from packaging,  Device confirmed in needle, then inserted full length of needle and withdrawn per handbook instructions. Nexplanon was able to palpated in the patient's arm; patient palpated the insert herself. There was minimal blood loss.  Patient insertion site covered with guaze and a pressure bandage to reduce any bruising.  The patient tolerated the procedure well and was given post procedure instructions.      Assessment and Plan:  1. Need for influenza vaccination - Flu Vaccine QUAD 36+ mos IM  2. Well woman exam with routine gynecological exam - Pap today. Patient is now > 21  3. Nexplanon insertion - UPT negative today - Patient to do home UPT in one month  4. Decreased sensation during intercourse - Reassured patient that orgasm from clitoral stimulation is more likely than with penetrative intercourse. She is still interested in a pelvic floor PT referral. Will place referral.   Will follow up results of pap smear and manage accordingly. Routine preventative health maintenance measures emphasized. Please refer to  After Visit Summary for other counseling recommendations.    Thressa Sheller DNP, CNM  07/16/20  10:15 AM

## 2020-07-16 NOTE — Progress Notes (Signed)
Pt reports unprotected intercourse 1 week ago. No menstrual period since delivery 04/11/20. UPT negative today.  Fleet Contras RN 07/16/20

## 2020-07-16 NOTE — Addendum Note (Signed)
Addended by: Maxwell Marion E on: 07/16/2020 11:50 AM   Modules accepted: Orders

## 2020-07-17 LAB — CYTOLOGY - PAP: Diagnosis: NEGATIVE

## 2020-09-02 ENCOUNTER — Ambulatory Visit: Payer: Medicaid Other | Admitting: Physical Therapy

## 2020-09-09 ENCOUNTER — Encounter: Payer: Medicaid Other | Admitting: Physical Therapy

## 2020-09-16 ENCOUNTER — Encounter: Payer: Self-pay | Admitting: Physical Therapy

## 2020-09-23 ENCOUNTER — Encounter: Payer: Self-pay | Admitting: Physical Therapy

## 2020-09-30 ENCOUNTER — Encounter: Payer: Self-pay | Admitting: Physical Therapy

## 2020-10-10 ENCOUNTER — Ambulatory Visit (INDEPENDENT_AMBULATORY_CARE_PROVIDER_SITE_OTHER): Payer: Medicaid Other | Admitting: *Deleted

## 2020-10-10 ENCOUNTER — Other Ambulatory Visit: Payer: Self-pay

## 2020-10-10 DIAGNOSIS — Z3202 Encounter for pregnancy test, result negative: Secondary | ICD-10-CM

## 2020-10-10 DIAGNOSIS — Z3200 Encounter for pregnancy test, result unknown: Secondary | ICD-10-CM

## 2020-10-10 LAB — POCT PREGNANCY, URINE: Preg Test, Ur: NEGATIVE

## 2020-10-10 NOTE — Progress Notes (Signed)
Pt submitted urine specimen earlier today for UPT testing. I called her and informed of negative results. She reports having a "light" positive result result on UPT @ home 2 days ago. She denies symptoms of pregnancy and did not state the reason she had performed UPT @ home. She reports LMP was end of March and was normal. I advised pt that if she does not have another period by beginning of May, she may check another test. Pt voiced understanding.

## 2020-10-10 NOTE — Progress Notes (Signed)
Chart reviewed for nurse visit. Agree with plan of care.   Marny Lowenstein, PA-C 10/10/2020 11:45 AM

## 2020-10-13 ENCOUNTER — Ambulatory Visit: Payer: Medicaid Other

## 2020-11-13 ENCOUNTER — Ambulatory Visit: Payer: Medicaid Other | Attending: Advanced Practice Midwife | Admitting: Physical Therapy

## 2024-03-26 ENCOUNTER — Other Ambulatory Visit: Payer: Self-pay

## 2024-03-26 ENCOUNTER — Encounter (HOSPITAL_COMMUNITY): Payer: Self-pay

## 2024-03-26 ENCOUNTER — Emergency Department (HOSPITAL_COMMUNITY)
Admission: EM | Admit: 2024-03-26 | Discharge: 2024-03-26 | Disposition: A | Discharge status: Home / Self Care | Payer: Self-pay | Attending: Emergency Medicine | Admitting: Emergency Medicine

## 2024-03-26 DIAGNOSIS — R112 Nausea with vomiting, unspecified: Secondary | ICD-10-CM | POA: Insufficient documentation

## 2024-03-26 LAB — COMPREHENSIVE METABOLIC PANEL WITH GFR
ALT: 24 U/L (ref 0–44)
AST: 22 U/L (ref 15–41)
Albumin: 4.2 g/dL (ref 3.5–5.0)
Alkaline Phosphatase: 113 U/L (ref 38–126)
Anion gap: 11 (ref 5–15)
BUN: 13 mg/dL (ref 6–20)
CO2: 24 mmol/L (ref 22–32)
Calcium: 9.5 mg/dL (ref 8.9–10.3)
Chloride: 106 mmol/L (ref 98–111)
Creatinine, Ser: 0.86 mg/dL (ref 0.44–1.00)
GFR, Estimated: >60 mL/min (ref >60)
Glucose, Bld: 91 mg/dL (ref 70–99)
Potassium: 4.1 mmol/L (ref 3.5–5.1)
Sodium: 141 mmol/L (ref 135–145)
Total Bilirubin: 0.4 mg/dL (ref 0.0–1.2)
Total Protein: 7.1 g/dL (ref 6.5–8.1)

## 2024-03-26 LAB — CBC
HCT: 44.6 % (ref 36.0–46.0)
Hemoglobin: 14.7 g/dL (ref 12.0–15.0)
MCH: 30.8 pg (ref 26.0–34.0)
MCHC: 33 g/dL (ref 30.0–36.0)
MCV: 93.3 fL (ref 80.0–100.0)
Platelets: 366 K/uL (ref 150–400)
RBC: 4.78 MIL/uL (ref 3.87–5.11)
RDW: 12.9 % (ref 11.5–15.5)
WBC: 9.8 K/uL (ref 4.0–10.5)
nRBC: 0 % (ref 0.0–0.2)

## 2024-03-26 LAB — URINALYSIS, ROUTINE W REFLEX MICROSCOPIC
Bacteria, UA: NONE SEEN
Bilirubin Urine: NEGATIVE
Glucose, UA: NEGATIVE mg/dL
Ketones, ur: 5 mg/dL — AB
Nitrite: NEGATIVE
Protein, ur: 30 mg/dL — AB
RBC / HPF: >50 RBC/hpf (ref 0–5)
Specific Gravity, Urine: 1.027 (ref 1.005–1.030)
pH: 6 (ref 5.0–8.0)

## 2024-03-26 LAB — HCG, SERUM, QUALITATIVE: Preg, Serum: NEGATIVE

## 2024-03-26 LAB — LIPASE, BLOOD: Lipase: 17 U/L (ref 11–51)

## 2024-03-26 MED ORDER — ONDANSETRON HCL 4 MG PO TABS: 4.0000 mg | ORAL_TABLET | Freq: Four times a day (QID) | ORAL | 0 refills | Status: AC

## 2024-03-26 MED ORDER — ONDANSETRON 4 MG PO TBDP: 4.0000 mg | ORAL_TABLET | Freq: Once | ORAL | Status: DC | PRN

## 2024-03-26 NOTE — ED Triage Notes (Signed)
 Pt has c/o N/V that began this AM. Pt reports vomiting twice today. Denies abd pain

## 2024-03-26 NOTE — ED Provider Notes (Signed)
 [place] [facility] AT [facility] Provider Note   CSN: [id number] Arrival date & time: 03/26/24  1206     Patient presents with: Emesis  HPI Cheryl Harrell is a 23 y.o. female presenting for nausea and vomiting that started this morning.  She states she had vomited twice this morning and has not since.  Had a normal bowel movement yesterday.  Denies abdominal pain or fever.  Denies urinary or vaginal symptoms.    Emesis      Prior to Admission medications   Medication Sig Start Date End Date Taking? Authorizing Provider  ondansetron  (ZOFRAN ) 4 MG tablet Take 1 tablet (4 mg total) by mouth every 6 (six) hours. 03/26/24  Yes Lang Norleen POUR, PA-C  bismuth subsalicylate (PEPTO BISMOL) 262 MG chewable tablet Chew 524 mg by mouth as needed.    [provider]  Prenatal Multivit-Min-Fe-FA (PRENATAL VITAMINS) 0.8 MG tablet Take 1 tablet by mouth daily. 04/15/18   Prentiss Bridgette RAMAN, DO    Allergies: Shellfish allergy and Penicillins    Review of Systems  Gastrointestinal:  Positive for vomiting.    Updated Vital Signs BP (!) 122/55 (BP Location: Right Arm)   Pulse 69   Temp 98.3 F (36.8 C) (Oral)   Resp 18   LMP  (LMP Unknown) Comment: irregular  SpO2 100%   Physical Exam Vitals and nursing note reviewed.  HENT:     Head: Normocephalic and atraumatic.     Mouth/Throat:     Mouth: Mucous membranes are moist.  Eyes:     General:        Right eye: No discharge.        Left eye: No discharge.     Conjunctiva/sclera: Conjunctivae normal.  Cardiovascular:     Rate and Rhythm: Normal rate and regular rhythm.     Pulses: Normal pulses.     Heart sounds: Normal heart sounds.  Pulmonary:     Effort: Pulmonary effort is normal.     Breath sounds: Normal breath sounds.  Abdominal:     General: Abdomen is flat.     Palpations: Abdomen is soft.  Skin:    General: Skin is warm and dry.  Neurological:     General: No focal deficit present.   Psychiatric:        Mood and Affect: Mood normal.     (all labs ordered are listed, but only abnormal results are displayed) Labs Reviewed  URINALYSIS, ROUTINE W REFLEX MICROSCOPIC - Abnormal; Notable for the following components:      Result Value   APPearance CLOUDY (*)    Hgb urine dipstick LARGE (*)    Ketones, ur 5 (*)    Protein, ur 30 (*)    Leukocytes,Ua MODERATE (*)    All other components within normal limits  LIPASE, BLOOD  COMPREHENSIVE METABOLIC PANEL WITH GFR  CBC  HCG, SERUM, QUALITATIVE    EKG: None  Radiology: No results found.   Procedures   Medications Ordered in the ED  ondansetron  (ZOFRAN -ODT) disintegrating tablet 4 mg (has no administration in time range)                                    Medical Decision Making Amount and/or Complexity of Data Reviewed Labs: ordered.  Risk Prescription drug management.   23 year old well-appearing female presenting for nausea and vomiting.  Exam is unremarkable.  Abdomen soft  and nontender.  She looks well, nontoxic and hemodynamically stable. She has not vomited for several hours.  And she stated on reassessment that she was feeling much better.  Requesting a work note.  Labs are unremarkable. Urinalysis did reveal some blood in the urine and pyuria but patient states she is recently on her menstrual cycle. This is likely contamination. Still denies urinary symptoms.  Suspect this could be a viral illness or foodborne illness.  Sent Zofran  to her pharmacy.  Advised her to follow-up with her PCP.  Discussed return precautions.  Discharged good condition.     Final diagnoses:  Nausea and vomiting, unspecified vomiting type    ED Discharge Orders          Ordered    ondansetron  (ZOFRAN ) 4 MG tablet  Every 6 hours        03/26/24 1521               Jakalyn Kratky K, PA-C 03/26/24 1521    Melvenia Motto, MD 03/26/24 [id number]
